# Patient Record
Sex: Female | Born: 1935 | Race: White | Hispanic: No | Marital: Married | Smoking: Never smoker
Health system: Southern US, Community
[De-identification: ages and names within clinical notes are randomized; demographics above are authoritative.]

## PROBLEM LIST (undated history)

## (undated) DIAGNOSIS — I4892 Unspecified atrial flutter: Secondary | ICD-10-CM

## (undated) DIAGNOSIS — E785 Hyperlipidemia, unspecified: Secondary | ICD-10-CM

## (undated) DIAGNOSIS — I4891 Unspecified atrial fibrillation: Secondary | ICD-10-CM

## (undated) DIAGNOSIS — I10 Essential (primary) hypertension: Secondary | ICD-10-CM

## (undated) DIAGNOSIS — I6529 Occlusion and stenosis of unspecified carotid artery: Secondary | ICD-10-CM

## (undated) HISTORY — DX: Hyperlipidemia, unspecified: E78.5

## (undated) HISTORY — DX: Essential (primary) hypertension: I10

## (undated) HISTORY — DX: Unspecified atrial flutter: I48.92

## (undated) HISTORY — DX: Occlusion and stenosis of unspecified carotid artery: I65.29

## (undated) HISTORY — DX: Unspecified atrial fibrillation: I48.91

---

## 2003-07-14 HISTORY — PX: BREAST BIOPSY: SHX20

## 2004-07-13 HISTORY — PX: LOBECTOMY: SHX5089

## 2004-08-07 ENCOUNTER — Ambulatory Visit: Payer: Self-pay | Admitting: Family Medicine

## 2005-05-26 ENCOUNTER — Ambulatory Visit: Payer: Self-pay | Admitting: Family Medicine

## 2005-06-03 ENCOUNTER — Ambulatory Visit: Payer: Self-pay | Admitting: Internal Medicine

## 2005-06-12 ENCOUNTER — Ambulatory Visit: Payer: Self-pay | Admitting: Internal Medicine

## 2005-06-13 ENCOUNTER — Ambulatory Visit: Payer: Self-pay | Admitting: Internal Medicine

## 2005-06-23 ENCOUNTER — Ambulatory Visit: Payer: Self-pay | Admitting: General Surgery

## 2005-07-01 ENCOUNTER — Inpatient Hospital Stay: Payer: Self-pay | Admitting: General Surgery

## 2005-07-14 ENCOUNTER — Ambulatory Visit: Payer: Self-pay | Admitting: General Surgery

## 2005-07-28 ENCOUNTER — Ambulatory Visit: Payer: Self-pay | Admitting: Internal Medicine

## 2005-08-14 ENCOUNTER — Ambulatory Visit: Payer: Self-pay | Admitting: General Surgery

## 2005-09-28 ENCOUNTER — Ambulatory Visit: Payer: Self-pay | Admitting: General Surgery

## 2005-11-17 ENCOUNTER — Ambulatory Visit: Payer: Self-pay | Admitting: Family Medicine

## 2005-12-01 ENCOUNTER — Ambulatory Visit: Payer: Self-pay | Admitting: Family Medicine

## 2006-04-22 ENCOUNTER — Ambulatory Visit: Payer: Self-pay | Admitting: Family Medicine

## 2006-05-07 ENCOUNTER — Ambulatory Visit: Payer: Self-pay | Admitting: Family Medicine

## 2007-05-05 ENCOUNTER — Ambulatory Visit: Payer: Self-pay | Admitting: Family Medicine

## 2007-06-28 ENCOUNTER — Ambulatory Visit: Payer: Self-pay | Admitting: General Surgery

## 2008-05-07 ENCOUNTER — Ambulatory Visit: Payer: Self-pay | Admitting: Family Medicine

## 2008-06-26 ENCOUNTER — Ambulatory Visit: Payer: Self-pay | Admitting: General Surgery

## 2008-08-23 ENCOUNTER — Ambulatory Visit: Payer: Self-pay | Admitting: Gastroenterology

## 2008-09-10 ENCOUNTER — Ambulatory Visit: Payer: Self-pay | Admitting: Gastroenterology

## 2008-09-13 ENCOUNTER — Ambulatory Visit: Payer: Self-pay | Admitting: Gastroenterology

## 2008-12-27 ENCOUNTER — Inpatient Hospital Stay: Payer: Self-pay | Admitting: Internal Medicine

## 2009-06-17 ENCOUNTER — Ambulatory Visit: Payer: Self-pay | Admitting: General Surgery

## 2009-12-12 ENCOUNTER — Ambulatory Visit: Payer: Self-pay | Admitting: Family Medicine

## 2010-05-13 ENCOUNTER — Ambulatory Visit: Payer: Self-pay | Admitting: Internal Medicine

## 2010-06-02 ENCOUNTER — Encounter: Payer: Self-pay | Admitting: Cardiovascular Disease

## 2010-06-03 ENCOUNTER — Inpatient Hospital Stay: Payer: Self-pay | Admitting: Internal Medicine

## 2010-06-03 ENCOUNTER — Encounter: Payer: Self-pay | Admitting: Cardiovascular Disease

## 2010-06-04 ENCOUNTER — Ambulatory Visit: Payer: Self-pay | Admitting: Cardiovascular Disease

## 2010-06-05 ENCOUNTER — Inpatient Hospital Stay: Payer: Self-pay | Admitting: *Deleted

## 2010-06-05 ENCOUNTER — Encounter: Payer: Self-pay | Admitting: Cardiovascular Disease

## 2010-06-12 ENCOUNTER — Ambulatory Visit: Payer: Self-pay | Admitting: Internal Medicine

## 2010-06-14 ENCOUNTER — Encounter: Payer: Self-pay | Admitting: Cardiovascular Disease

## 2010-06-16 ENCOUNTER — Ambulatory Visit: Payer: Self-pay | Admitting: Internal Medicine

## 2010-06-25 ENCOUNTER — Encounter: Payer: Self-pay | Admitting: Cardiovascular Disease

## 2010-06-25 ENCOUNTER — Ambulatory Visit: Payer: Self-pay | Admitting: Cardiovascular Disease

## 2010-06-25 DIAGNOSIS — I495 Sick sinus syndrome: Secondary | ICD-10-CM | POA: Insufficient documentation

## 2010-06-25 DIAGNOSIS — D0220 Carcinoma in situ of unspecified bronchus and lung: Secondary | ICD-10-CM | POA: Insufficient documentation

## 2010-06-25 DIAGNOSIS — I4891 Unspecified atrial fibrillation: Secondary | ICD-10-CM | POA: Insufficient documentation

## 2010-06-25 DIAGNOSIS — I10 Essential (primary) hypertension: Secondary | ICD-10-CM

## 2010-07-03 ENCOUNTER — Encounter: Payer: Self-pay | Admitting: Cardiovascular Disease

## 2010-07-13 ENCOUNTER — Ambulatory Visit: Payer: Self-pay | Admitting: Internal Medicine

## 2010-08-12 NOTE — Letter (Signed)
Summary: Discharge [facility]  Discharge Summary-[facility]-Danyela Torr   Imported By: Harlon Flor 06/17/2010 10:24:26  _____________________________________________________________________  External Attachment:    Type:   Image     Comment:   External Document

## 2010-08-12 NOTE — Medication Information (Signed)
Summary: Tax adviser   Imported By: [place] Carbo 06/06/2010 09:20:32  _____________________________________________________________________  External Attachment:    Type:   Image     Comment:   External Document

## 2010-08-12 NOTE — Letter (Signed)
SummaryScientist, physiological [facility]   [facility]   Imported By: Roderic Ovens 06/17/2010 16:04:01  _____________________________________________________________________  External Attachment:    Type:   Image     Comment:   External Document

## 2010-08-13 ENCOUNTER — Ambulatory Visit: Payer: Self-pay | Admitting: Internal Medicine

## 2010-08-14 NOTE — Consult Note (Signed)
SummaryScientist, physiological [facility]   [facility]   Imported By: Roderic Ovens 07/10/2010 11:36:07  _____________________________________________________________________  External Attachment:    Type:   Image     Comment:   External Document

## 2010-08-14 NOTE — Letter (Signed)
SummaryScientist, physiological [facility]   [facility]   Imported By: Roderic Ovens 07/10/2010 11:36:39  _____________________________________________________________________  External Attachment:    Type:   Image     Comment:   External Document

## 2010-08-14 NOTE — Assessment & Plan Note (Signed)
Summary: [facility]AMD   Visit Type:  Initial Consult Primary Provider:  Wonda Cheng, M.D.  CC:  F/U [facility].  c/o feeling fatigue and shortness of breath..  History of Present Illness: Ms. Foody is a very pleasant 77 year old woman with history of COPD, remote smoking history, lung cancer with recent admission to [facility] for acute respiratory failure, hypertension, rapid atrial ablation/flutter who presents to establish care.   She was seen by myself in the hospital on November 22. She was in atrial fibrillation with RVR. She was started on metoprolol and amiodarone. She converted to normal sinus rhythm and was discharged with rates in the 70s. She presents today and states that she is doing well. Her husband reports she has some thick cough and shortness of breath in the morning this clears up as the day goes on. She does use inhalers, oxygen and nebulizers.. She denies any significant chest pain. Now back having radiation treatment and has 17 more treatments to go.  Echocardiogram November 22 shows normal systolic function, ejection fraction 50-55%, diastolic dysfunction, normal right ventricular systolic pressures. Normal left atrial size.  CT Scan showed a large subcarinal mediastinal mass with invasion of the right mainstem bronchus.  EKG today shows sinus bradycardia with rate 45 beats per minute, nonspecific ST changes  Current Medications (verified): 1)  Fergon 240 (27 Fe) Mg Tabs (Ferrous Gluconate) .Marland Kitchen.. 1 Tablet Once Daily 2)  Alendronate Sodium 70 Mg Tabs (Alendronate Sodium) .Marland Kitchen.. 1 Tablet Once Daily 3)  Vitamin D3 1000 Unit Caps (Cholecalciferol) .Marland Kitchen.. 1 Tablet Once Daily 4)  Methotrexate 2.5 Mg Tabs (Methotrexate Sodium) .Marland Kitchen.. 1 Tablet Once A Week 5)  Protonix 40 Mg Solr (Pantoprazole Sodium) .Marland Kitchen.. 1 Tablet Once Daily 6)  Folic Acid 1 Mg Tabs (Folic Acid) .Marland Kitchen.. 1 Tablet Once Daily 7)  Lovastatin 40 Mg Tabs (Lovastatin) .Marland Kitchen.. 1 Tablet Once Daily 8)  Oscal 500/200 D-3 500-200 Mg-Unit  Tabs (Calcium Carbonate-Vitamin D) .Marland Kitchen.. 1 Tablet Daily 9)  Amiodarone Hcl 200 Mg Tabs (Amiodarone Hcl) .Marland Kitchen.. 1 Tablet Two Times A Day 10)  Metoprolol Succinate 25 Mg Xr24h-Tab (Metoprolol Succinate) .Marland Kitchen.. 1 Tablet Two Times A Day 11)  Ecotrin 325 Mg Tbec (Aspirin) .... One Tablet Once Daily 12)  Furosemide 20 Mg Tabs (Furosemide) .... One Tablet Once Daily 13)  Potassium Chloride Cr 10 Meq Cr-Caps (Potassium Chloride) .... One Tablet Once Daily 14)  Lisinopril 10 Mg Tabs (Lisinopril) .... Take Two Tablets Daily 15)  Combivent 18-103 Mcg/act Aero (Ipratropium-Albuterol) .... Two Puffs As Needed Every 4-6 Hours As Needed  Allergies (verified): 1)  ! Amoxicillin  Past History:  Past Medical History: Last updated: 2010/07/05 Atrial Fibrillation Atrial Flutter Carotid stenosis Hyperlipidemia Hypertension  Past Surgical History: Last updated: 07/05/10 Right lobe lobectomy in 2006 Right breast bx in 2005 found to be fibroadenoma  Family History: Last updated: 07/05/10 Father:deceased-pneumonia Mother:deceased-cardiac disease  Social History: Last updated: 07/05/10 Retired  Married  Tobacco Use - No.  Alcohol Use - no Regular Exercise - no Drug Use - no  Risk Factors: Exercise: no (July 05, 2010)  Risk Factors: Smoking Status: never (05-Jul-2010)  Review of Systems  The patient denies fever, weight loss, weight gain, vision loss, decreased hearing, hoarseness, chest pain, syncope, dyspnea on exertion, peripheral edema, prolonged cough, abdominal pain, incontinence, muscle weakness, depression, and enlarged lymph nodes.         SOB in Am with cough that clears  Vital Signs:  Patient profile:   77 year old female Height:  65 inches Weight:      106.25 pounds BMI:     17.74 Pulse rate:   45 / minute BP sitting:   126 / 70  (left arm) Cuff size:   regular  Vitals Entered By: Lysbeth Galas CMA (June 25, 2010 11:26 AM)  Physical Exam  General:  Thin elderly  woman appearing very frail Head:  normocephalic and atraumatic Neck:  Neck supple, no JVD. No masses, thyromegaly or abnormal cervical nodes. Lungs:  decreased breath sounds on the right, clear on the left Heart:  Non-displaced PMI, chest non-tender; regular rate and rhythm, S1, S2 with II/VI SEM RSB,  norubs or gallops. Carotid upstroke normal, no bruit. Pedals normal pulses. No edema, no varicosities. Abdomen:  Bowel sounds positive; abdomen soft and non-tender without masses Msk:  steady though slow gait, normal muscle tone given her age Pulses:  pulses normal in all 4 extremities Extremities:  No clubbing or cyanosis. Neurologic:  Alert and oriented x 3. Skin:  Intact without lesions or rashes. Psych:  Normal affect.   Impression & Recommendations:  Problem # 1:  ATRIAL FIBRILLATION (ICD-427.31) she is maintaining normal sinus rhythm on amiodarone and metoprolol. Her heart rate is low and we will decrease her amiodarone and metoprolol by one half.  Her updated medication list for this problem includes:    Amiodarone Hcl 200 Mg Tabs (Amiodarone hcl) .Marland Kitchen... 1 tablet once daily    Metoprolol Tartrate 25 Mg Tabs (Metoprolol tartrate) .Marland Kitchen... 1/2 tablet two times a day    Ecotrin 325 Mg Tbec (Aspirin) ..... One tablet once daily  Problem # 2:  SINUS BRADYCARDIA (ICD-427.81) sinus bradycardia today in the office. We will decrease her medications as above. She is relatively asymptomatic.  Her updated medication list for this problem includes:    Amiodarone Hcl 200 Mg Tabs (Amiodarone hcl) .Marland Kitchen... 1 tablet once daily    Metoprolol Tartrate 25 Mg Tabs (Metoprolol tartrate) .Marland Kitchen... 1/2 tablet two times a day    Ecotrin 325 Mg Tbec (Aspirin) ..... One tablet once daily    Lisinopril 10 Mg Tabs (Lisinopril) .Marland Kitchen... Take two tablets daily  Problem # 3:  HYPERTENSION, BENIGN (ICD-401.1) Blood pressure is significantly well controlled on her current medication regimen. I suspect her previous  hypertension on admission to the hospital was secondary to her respiratory distress.  Her updated medication list for this problem includes:    Metoprolol Tartrate 25 Mg Tabs (Metoprolol tartrate) .Marland Kitchen... 1/2 tablet two times a day    Ecotrin 325 Mg Tbec (Aspirin) ..... One tablet once daily    Furosemide 20 Mg Tabs (Furosemide) ..... One tablet once daily    Lisinopril 10 Mg Tabs (Lisinopril) .Marland Kitchen... Take two tablets daily  Problem # 4:  CARCINOMA IN SITU OF BRONCHUS AND LUNG (ICD-231.2) Lung cancer, undergoing radiation treatment.   Patient Instructions: 1)  Your physician recommends that you schedule a follow-up appointment in: 3 months 2)  Your physician has recommended you make the following change in your medication: DECREASE Amiodarone 200mg  to once daily. DECREASE Metoprolol Tartrate to 1/2 tablet two times a day. Prescriptions: METOPROLOL TARTRATE 25 MG TABS (METOPROLOL TARTRATE) 1/2 tablet two times a day  #60 x 6   Entered by:   Lanny Hurst RN   Authorized by:   Dossie Arbour MD   Signed by:   Lanny Hurst RN on 06/25/2010   Method used:   Electronically to        [facility] [phone]* (retail)  [address] [place]  [address]       Ph: [phone]       Fax: [phone]   RxID:   [id number]

## 2010-08-14 NOTE — Miscellaneous (Signed)
Summary: Home Care Report  Home Care Report   Imported By: Lysbeth Galas CMA 07/08/2010 12:10:55  _____________________________________________________________________  External Attachment:    Type:   Image     Comment:   External Document

## 2010-09-09 ENCOUNTER — Encounter: Payer: Self-pay | Admitting: Cardiovascular Disease

## 2010-09-11 ENCOUNTER — Ambulatory Visit: Payer: Self-pay | Admitting: Internal Medicine

## 2010-09-22 ENCOUNTER — Encounter: Payer: Self-pay | Admitting: Cardiovascular Disease

## 2010-09-23 NOTE — Miscellaneous (Signed)
Summary: Orders  Orders   Imported By: Harlon Flor 09/17/2010 12:54:44  _____________________________________________________________________  External Attachment:    Type:   Image     Comment:   External Document

## 2010-09-24 ENCOUNTER — Ambulatory Visit: Payer: Self-pay | Admitting: Cardiovascular Disease

## 2010-09-30 NOTE — Miscellaneous (Signed)
Summary: Hospice  Hospice   Imported By: Harlon Flor 09/23/2010 09:43:31  _____________________________________________________________________  External Attachment:    Type:   Image     Comment:   External Document

## 2010-10-12 ENCOUNTER — Ambulatory Visit: Payer: Self-pay | Admitting: Internal Medicine

## 2011-10-10 ENCOUNTER — Emergency Department: Payer: Self-pay | Admitting: Emergency Medicine

## 2012-01-08 IMAGING — CT CT CHEST W/ CM
2 series · 15 of 31 positions shown, 19 images · IV contrast (agent unspecified)
Comparison: none

REASON FOR EXAM: dyspnea, elevated d dimer, pe protocol
COMMENTS:   May transport without cardiac monitor

PROCEDURE:     CT  - CT CHEST WITH CONTRAST  - June 02, 2010  [DATE]
RESULT:
TECHNIQUE: Helical 3 mm sections were obtained from the thoracic inlet
through the lung bases status post intravenous administration of 100 ml of
[id number].

[Series 8: soft tissue · axial · 0.63mm/px · z∈[-842,-800]mm · 2 of 92 slices shown]
[im 8/92  mediastinal]
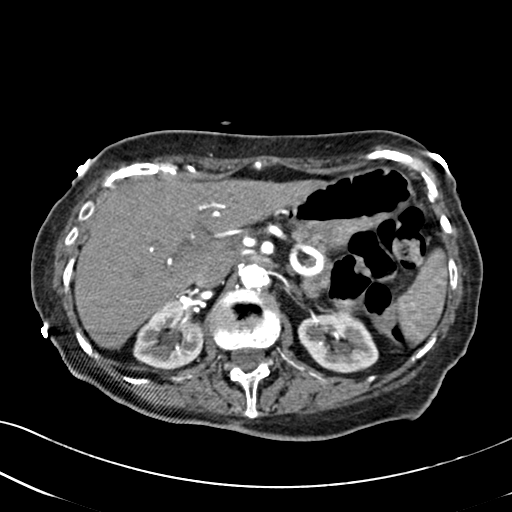
[im 22/92  mediastinal]
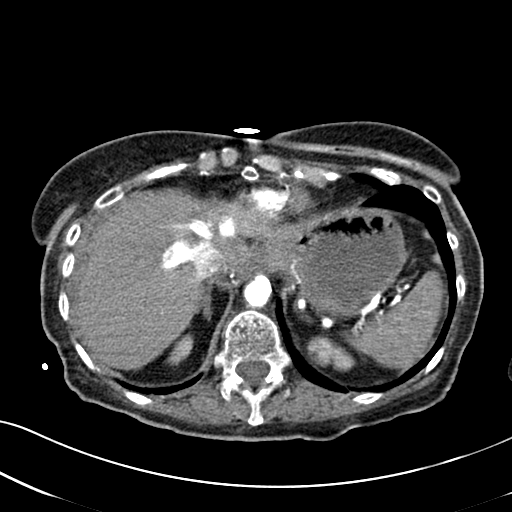

[Series 9: lung windows · axial · 0.63mm/px · z∈[-836,-611]mm · 13 of 89 slices shown, 17 images]
[im 7/89  mediastinal]
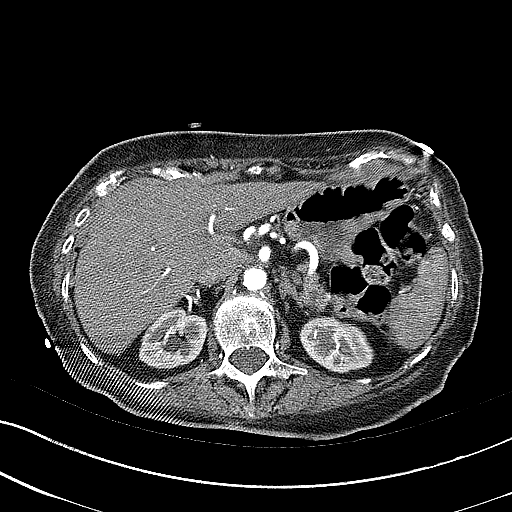
[im 7/89  lung]
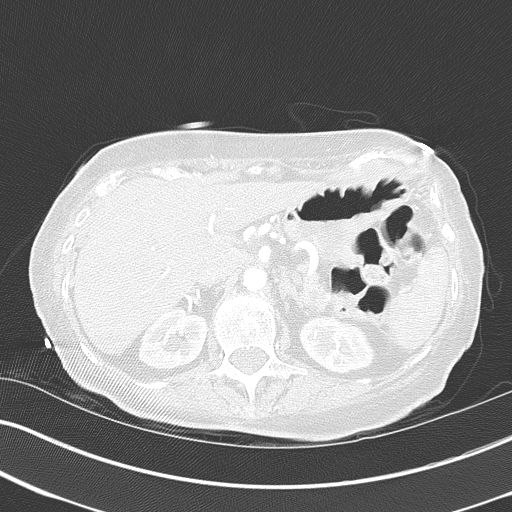
[im 14/89  lung]
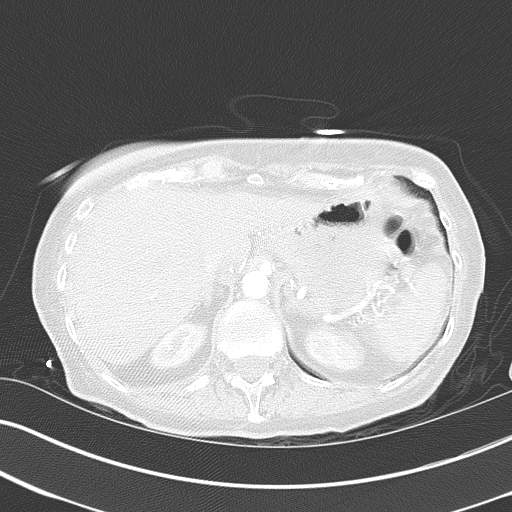
[im 21/89  lung]
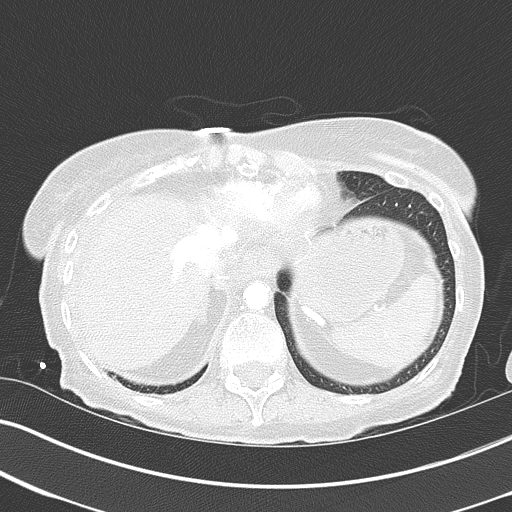
[im 28/89  lung]
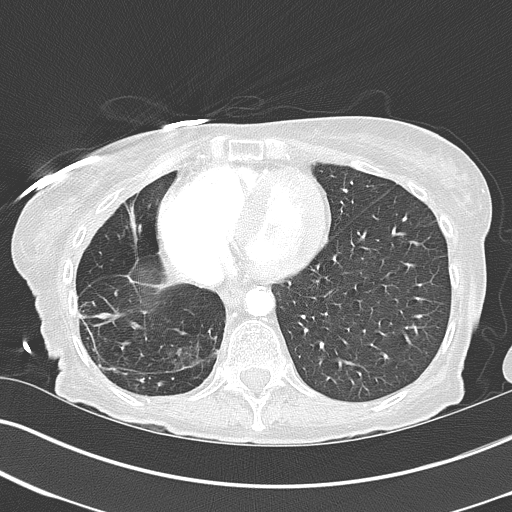
[im 34/89  mediastinal]
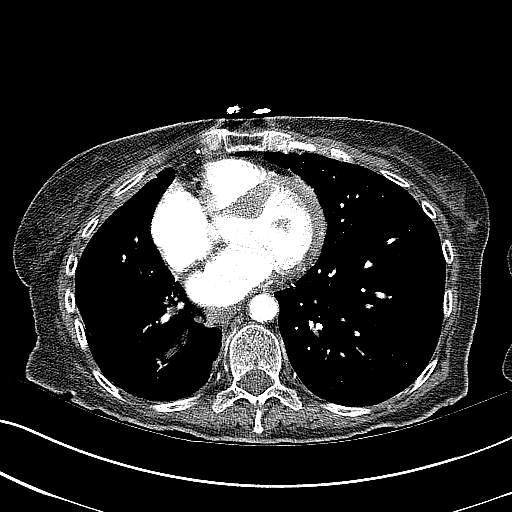
[im 34/89  lung]
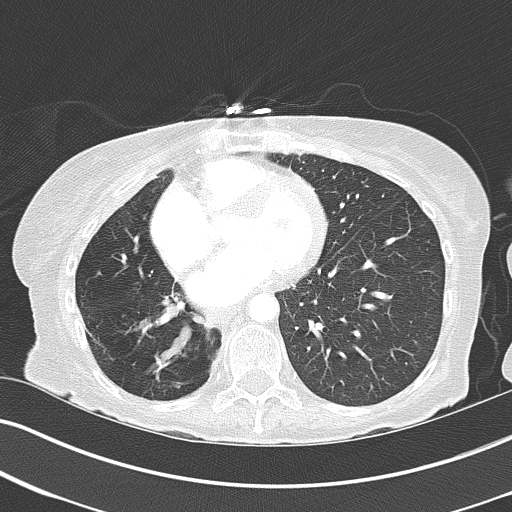
[im 41/89  lung]
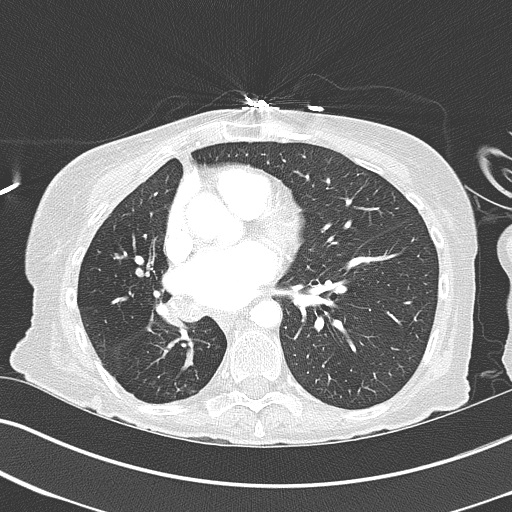
[im 45/89  lung]
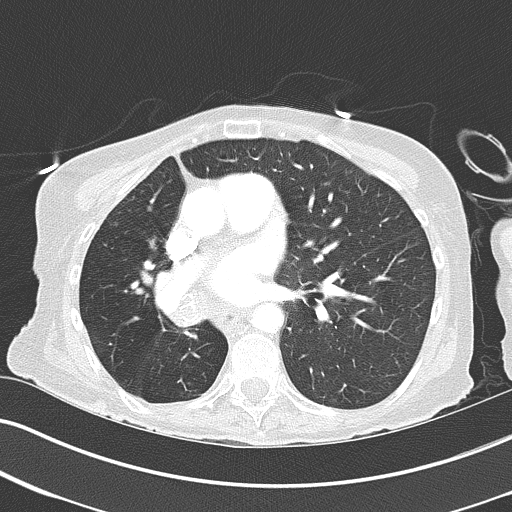
[im 48/89  lung]
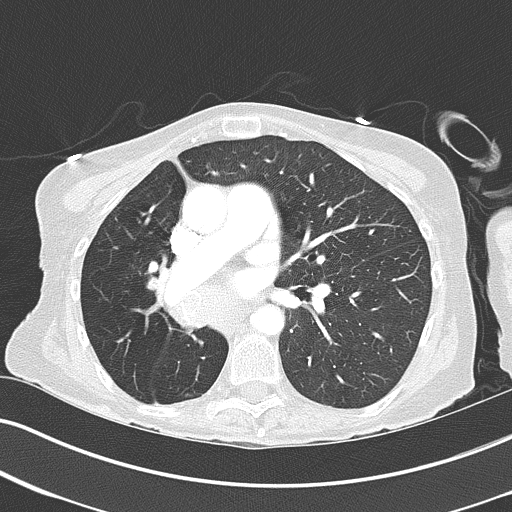
[im 55/89  mediastinal]
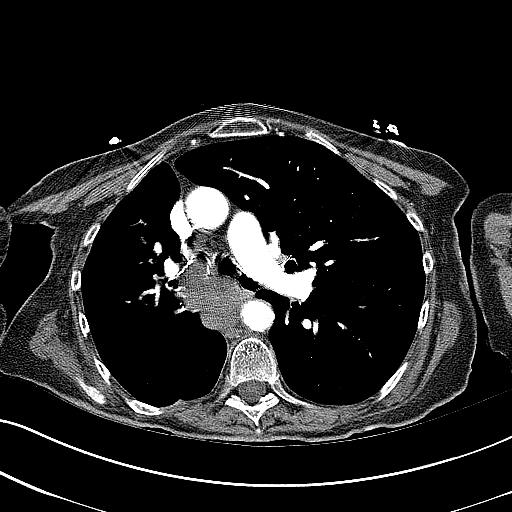
[im 55/89  lung]
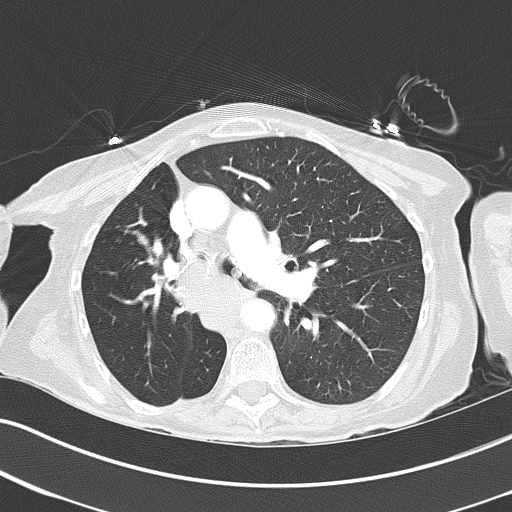
[im 61/89  lung]
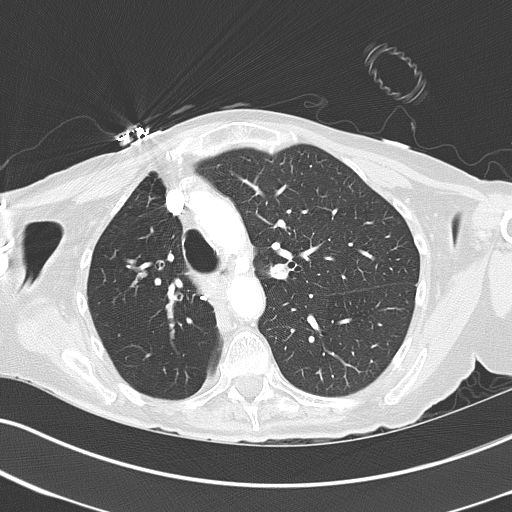
[im 68/89  lung]
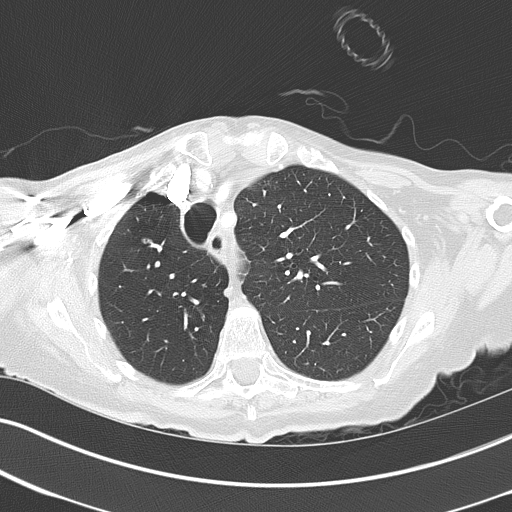
[im 75/89  lung]
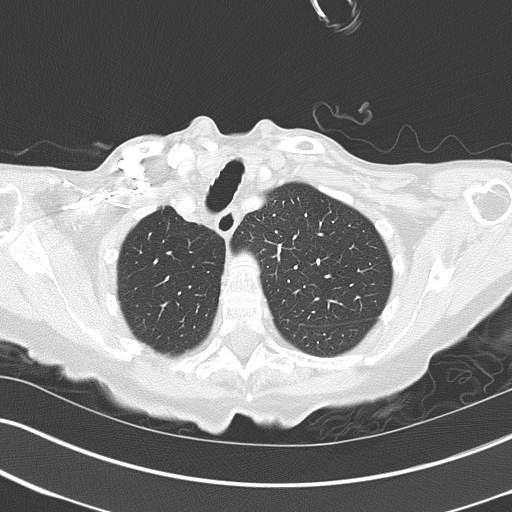
[im 82/89  mediastinal]
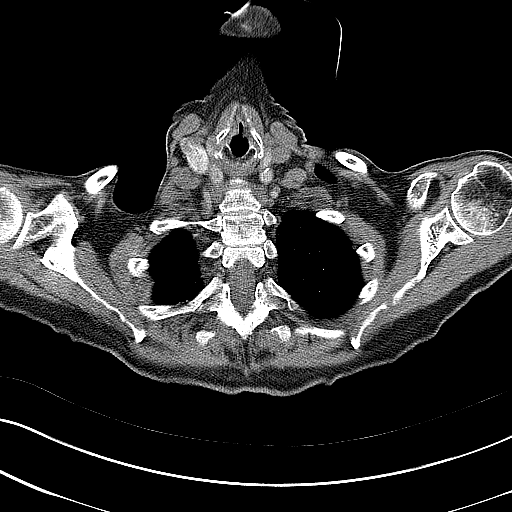
[im 82/89  lung]
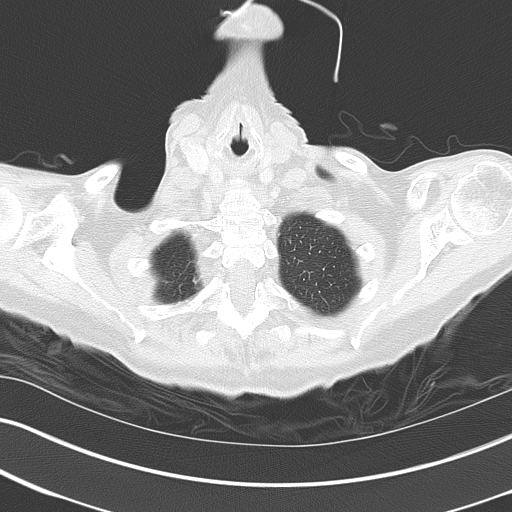

[15 of 31 positions shown; findings below may reference images not displayed]

FINDINGS: Evaluation of the mediastinum and hilar regions and structures
demonstrates a 4.52 x 5.15 cm, subcarinal mass. There are findings
consistent with invasion of the right mainstem bronchus as well as invasion
of the bronchus of the right lower lobe. The mass also partially compresses
or possibly invades the left mainstem bronchus. The right distal mainstem
bronchial invasion is extensive. There is no evidence of post obstructive
atelectasis or post obstructive pneumonitis. No further masses or adenopathy
is appreciated. There is no evidence of filling defects within the main,
lobar, or segmental pulmonary arteries. Evaluation of the lung parenchyma
demonstrates volume loss within the right hemithorax when compared to the
left as well as shift of the mediastinal structures to the right. No focal
regions of consolidation or focal infiltrates are appreciated. Linear areas
of increased density project within the right lung base likely representing
scarring and/or atelectasis.

The visualized upper abdominal viscera demonstrate no gross abnormalities.
IMPRESSION: 1.  Large subcarinal mediastinal mass as described above with invasion of
the right mainstem bronchus.
2.  No CT evidence of pulmonary arterial embolic disease.
3.  Dr. Tarla of the Emergency Department was informed of these findings
via a preliminary faxed report.

## 2012-08-11 ENCOUNTER — Ambulatory Visit: Payer: Self-pay | Admitting: Internal Medicine

## 2012-09-07 ENCOUNTER — Ambulatory Visit: Payer: Self-pay | Admitting: Internal Medicine

## 2012-09-07 LAB — BASIC METABOLIC PANEL
Anion Gap: 9 (ref 7–16)
BUN: 12 mg/dL (ref 7–18)
Creatinine: 0.84 mg/dL (ref 0.60–1.30)
EGFR (Non-African Amer.): >60
Glucose: 95 mg/dL (ref 65–99)
Osmolality: 283 (ref 275–301)
Potassium: 4.6 mmol/L (ref 3.5–5.1)
Sodium: 142 mmol/L (ref 136–145)

## 2012-09-07 LAB — HEPATIC FUNCTION PANEL A (ARMC)
Bilirubin, Direct: 0.1 mg/dL (ref 0.00–0.20)
Bilirubin,Total: 0.4 mg/dL (ref 0.2–1.0)
SGOT(AST): 16 U/L (ref 15–37)
SGPT (ALT): 17 U/L (ref 12–78)
Total Protein: 7.3 g/dL (ref 6.4–8.2)

## 2012-09-07 LAB — CBC CANCER CENTER
Basophil #: 0.1 x10 3/mm (ref 0.0–0.1)
Eosinophil #: 0.1 x10 3/mm (ref 0.0–0.7)
HCT: 40.6 % (ref 35.0–47.0)
Lymphocyte %: 10.3 %
MCH: 32.5 pg (ref 26.0–34.0)
MCHC: 33.3 g/dL (ref 32.0–36.0)
MCV: 98 fL (ref 80–100)
Monocyte #: 0.6 x10 3/mm (ref 0.2–0.9)
Neutrophil #: 7.3 x10 3/mm — ABNORMAL HIGH (ref 1.4–6.5)
Platelet: 290 x10 3/mm (ref 150–440)
WBC: 9 x10 3/mm (ref 3.6–11.0)

## 2012-09-10 ENCOUNTER — Ambulatory Visit: Payer: Self-pay | Admitting: Internal Medicine

## 2013-04-12 ENCOUNTER — Ambulatory Visit: Payer: Self-pay | Admitting: Internal Medicine

## 2013-04-14 ENCOUNTER — Ambulatory Visit: Payer: Self-pay | Admitting: Orthopedic Surgery

## 2013-04-14 ENCOUNTER — Inpatient Hospital Stay: Payer: Self-pay | Admitting: Internal Medicine

## 2013-04-14 LAB — URINALYSIS, COMPLETE
Bilirubin,UR: NEGATIVE
Blood: NEGATIVE
Glucose,UR: NEGATIVE mg/dL (ref 0–75)
Ketone: NEGATIVE
Leukocyte Esterase: NEGATIVE
Protein: NEGATIVE
WBC UR: <1 /HPF (ref 0–5)

## 2013-04-14 LAB — BASIC METABOLIC PANEL
BUN: 11 mg/dL (ref 7–18)
Co2: 28 mmol/L (ref 21–32)
Creatinine: 0.73 mg/dL (ref 0.60–1.30)
EGFR (African American): >60
EGFR (Non-African Amer.): >60
Glucose: 109 mg/dL — ABNORMAL HIGH (ref 65–99)

## 2013-04-14 LAB — CBC
HCT: 39.3 % (ref 35.0–47.0)
MCHC: 33.6 g/dL (ref 32.0–36.0)
MCV: 95 fL (ref 80–100)
RBC: 4.14 10*6/uL (ref 3.80–5.20)

## 2013-04-15 LAB — CBC WITH DIFFERENTIAL/PLATELET
Basophil #: 0.1 10*3/uL (ref 0.0–0.1)
Basophil %: 0.5 %
Eosinophil #: 0 10*3/uL (ref 0.0–0.7)
Eosinophil %: 0 %
HGB: 12 g/dL (ref 12.0–16.0)
Lymphocyte %: 4.4 %
MCHC: 34.1 g/dL (ref 32.0–36.0)
Monocyte #: 0.8 x10 3/mm (ref 0.2–0.9)
Monocyte %: 6.1 %
RDW: 14 % (ref 11.5–14.5)
WBC: 12.8 10*3/uL — ABNORMAL HIGH (ref 3.6–11.0)

## 2013-04-15 LAB — BASIC METABOLIC PANEL
Anion Gap: 6 — ABNORMAL LOW (ref 7–16)
BUN: 13 mg/dL (ref 7–18)
Co2: 29 mmol/L (ref 21–32)
Creatinine: 0.78 mg/dL (ref 0.60–1.30)
EGFR (African American): >60
EGFR (Non-African Amer.): >60
Glucose: 123 mg/dL — ABNORMAL HIGH (ref 65–99)
Osmolality: 281 (ref 275–301)
Potassium: 4 mmol/L (ref 3.5–5.1)

## 2013-04-15 LAB — MRSA PCR SCREENING

## 2013-04-16 LAB — CBC WITH DIFFERENTIAL/PLATELET
Basophil #: 0.1 10*3/uL (ref 0.0–0.1)
Basophil %: 0.2 %
Eosinophil #: 0 10*3/uL (ref 0.0–0.7)
Eosinophil %: 0 %
HCT: 28.7 % — ABNORMAL LOW (ref 35.0–47.0)
HGB: 9.7 g/dL — ABNORMAL LOW (ref 12.0–16.0)
MCH: 32.5 pg (ref 26.0–34.0)
MCHC: 33.7 g/dL (ref 32.0–36.0)
MCV: 97 fL (ref 80–100)
Monocyte #: 1.5 x10 3/mm — ABNORMAL HIGH (ref 0.2–0.9)
Monocyte %: 6.4 %
Neutrophil %: 90.3 %
RBC: 2.97 10*6/uL — ABNORMAL LOW (ref 3.80–5.20)
RDW: 14.5 % (ref 11.5–14.5)
WBC: 24.2 10*3/uL — ABNORMAL HIGH (ref 3.6–11.0)

## 2013-04-16 LAB — HEPATIC FUNCTION PANEL A (ARMC)
Albumin: 2.3 g/dL — ABNORMAL LOW (ref 3.4–5.0)
Alkaline Phosphatase: 65 U/L (ref 50–136)
Bilirubin, Direct: 0.2 mg/dL (ref 0.00–0.20)
Bilirubin,Total: 0.6 mg/dL (ref 0.2–1.0)
SGOT(AST): 23 U/L (ref 15–37)
Total Protein: 4.8 g/dL — ABNORMAL LOW (ref 6.4–8.2)

## 2013-04-16 LAB — BASIC METABOLIC PANEL
BUN: 17 mg/dL (ref 7–18)
Chloride: 106 mmol/L (ref 98–107)
EGFR (Non-African Amer.): >60
Glucose: 94 mg/dL (ref 65–99)
Potassium: 5.4 mmol/L — ABNORMAL HIGH (ref 3.5–5.1)

## 2013-04-16 LAB — ALBUMIN: Albumin: 2.3 g/dL — ABNORMAL LOW (ref 3.4–5.0)

## 2013-04-18 LAB — CBC WITH DIFFERENTIAL/PLATELET
Basophil #: 0 10*3/uL (ref 0.0–0.1)
Eosinophil %: 0.2 %
HCT: 22.6 % — ABNORMAL LOW (ref 35.0–47.0)
Lymphocyte #: 0.5 10*3/uL — ABNORMAL LOW (ref 1.0–3.6)
Lymphocyte %: 4.5 %
MCH: 32.4 pg (ref 26.0–34.0)
MCHC: 34.3 g/dL (ref 32.0–36.0)
Monocyte %: 6.4 %
Neutrophil #: 10.6 10*3/uL — ABNORMAL HIGH (ref 1.4–6.5)
Neutrophil %: 88.5 %
Platelet: 202 10*3/uL (ref 150–440)
WBC: 12 10*3/uL — ABNORMAL HIGH (ref 3.6–11.0)

## 2013-04-18 LAB — BASIC METABOLIC PANEL
Anion Gap: 8 (ref 7–16)
BUN: 12 mg/dL (ref 7–18)
Chloride: 102 mmol/L (ref 98–107)
EGFR (African American): >60
Osmolality: 268 (ref 275–301)
Potassium: 3 mmol/L — ABNORMAL LOW (ref 3.5–5.1)

## 2013-04-19 LAB — BASIC METABOLIC PANEL
Anion Gap: 6 — ABNORMAL LOW (ref 7–16)
Chloride: 103 mmol/L (ref 98–107)
EGFR (Non-African Amer.): >60
Glucose: 75 mg/dL (ref 65–99)
Osmolality: 267 (ref 275–301)
Potassium: 3.3 mmol/L — ABNORMAL LOW (ref 3.5–5.1)

## 2013-04-19 LAB — PATHOLOGY REPORT

## 2013-04-20 LAB — MAGNESIUM: Magnesium: 1.6 mg/dL — ABNORMAL LOW

## 2013-04-21 LAB — CBC WITH DIFFERENTIAL/PLATELET
Basophil %: 0.5 %
Eosinophil #: 0.1 10*3/uL (ref 0.0–0.7)
Lymphocyte #: 0.6 10*3/uL — ABNORMAL LOW (ref 1.0–3.6)
MCH: 33.1 pg (ref 26.0–34.0)
Monocyte #: 1.3 x10 3/mm — ABNORMAL HIGH (ref 0.2–0.9)
Platelet: 286 10*3/uL (ref 150–440)
RBC: 3.39 10*6/uL — ABNORMAL LOW (ref 3.80–5.20)
RDW: 15.5 % — ABNORMAL HIGH (ref 11.5–14.5)

## 2013-04-21 LAB — MAGNESIUM: Magnesium: 1.6 mg/dL — ABNORMAL LOW

## 2013-04-21 LAB — POTASSIUM: Potassium: 4 mmol/L (ref 3.5–5.1)

## 2013-05-05 ENCOUNTER — Inpatient Hospital Stay: Payer: Self-pay | Admitting: Internal Medicine

## 2013-05-05 LAB — COMPREHENSIVE METABOLIC PANEL
Albumin: 2.4 g/dL — ABNORMAL LOW (ref 3.4–5.0)
Alkaline Phosphatase: 134 U/L (ref 50–136)
BUN: 20 mg/dL — ABNORMAL HIGH (ref 7–18)
Bilirubin,Total: 0.5 mg/dL (ref 0.2–1.0)
Calcium, Total: 9.2 mg/dL (ref 8.5–10.1)
Co2: 31 mmol/L (ref 21–32)
Creatinine: 0.71 mg/dL (ref 0.60–1.30)
EGFR (African American): >60
EGFR (Non-African Amer.): >60
Osmolality: 270 (ref 275–301)
Potassium: 5.3 mmol/L — ABNORMAL HIGH (ref 3.5–5.1)
SGOT(AST): 32 U/L (ref 15–37)
SGPT (ALT): 19 U/L (ref 12–78)
Sodium: 128 mmol/L — ABNORMAL LOW (ref 136–145)

## 2013-05-05 LAB — CBC
HCT: 35.7 % (ref 35.0–47.0)
MCHC: 33.9 g/dL (ref 32.0–36.0)
MCV: 93 fL (ref 80–100)
Platelet: 440 10*3/uL (ref 150–440)
RBC: 3.84 10*6/uL (ref 3.80–5.20)
RDW: 14.9 % — ABNORMAL HIGH (ref 11.5–14.5)

## 2013-05-05 LAB — CK TOTAL AND CKMB (NOT AT ARMC): CK, Total: 45 U/L (ref 21–215)

## 2013-05-05 LAB — TROPONIN I: Troponin-I: 0.39 ng/mL — ABNORMAL HIGH

## 2013-05-06 LAB — CBC WITH DIFFERENTIAL/PLATELET
Basophil #: 0 10*3/uL (ref 0.0–0.1)
Eosinophil #: 0 10*3/uL (ref 0.0–0.7)
Eosinophil %: 0 %
Lymphocyte %: 2.8 %
MCHC: 33.4 g/dL (ref 32.0–36.0)
MCV: 93 fL (ref 80–100)
Monocyte #: 0.5 x10 3/mm (ref 0.2–0.9)
Neutrophil #: 12.6 10*3/uL — ABNORMAL HIGH (ref 1.4–6.5)
Neutrophil %: 93.3 %
Platelet: 374 10*3/uL (ref 150–440)
RBC: 3.88 10*6/uL (ref 3.80–5.20)

## 2013-05-06 LAB — COMPREHENSIVE METABOLIC PANEL
Albumin: 2.1 g/dL — ABNORMAL LOW (ref 3.4–5.0)
Alkaline Phosphatase: 108 U/L (ref 50–136)
Anion Gap: 7 (ref 7–16)
BUN: 22 mg/dL — ABNORMAL HIGH (ref 7–18)
Bilirubin,Total: 0.5 mg/dL (ref 0.2–1.0)
Co2: 28 mmol/L (ref 21–32)
Creatinine: 1.19 mg/dL (ref 0.60–1.30)
Glucose: 137 mg/dL — ABNORMAL HIGH (ref 65–99)
Osmolality: 272 (ref 275–301)

## 2013-05-10 LAB — CULTURE, BLOOD (SINGLE)

## 2013-05-13 ENCOUNTER — Ambulatory Visit: Payer: Self-pay | Admitting: Internal Medicine

## 2013-05-13 DEATH — deceased

## 2014-11-02 NOTE — H&P (Signed)
PATIENT NAME:  Christina Flowers, Christina Flowers MR#:  [id number] DATE OF BIRTH:  [birth year]  DATE OF ADMISSION:  04/14/2013  PRIMARY CARE PROVIDER: Dr. Sherrlyn Hock, followed by home hospice.   REFERRING PHYSICIAN: Dr. Shaune Pollack.   CHIEF COMPLAINT: Status post fall, right hip fracture.   HISTORY OF PRESENT ILLNESS: The patient is an 81 year old white female with history of recurrent lung cancer who is currently being followed by hospice due to her lung cancer and advanced dementia. She is mostly wheelchair bound but is able to get into the wheelchair with her husband's assistance. She was in a rocking chair and tried to get up and fell. She sustained a hip fracture, and we are asked to admit the patient. The patient has advanced dementia according to her husband and is unable to give me any history. He reports that she was doing okay with her baseline and has been eating and drinking normally. He does report that she has been coughing a lot more. Again, unable to provide any history. She does have a history of paroxysmal Afib.    PAST MEDICAL HISTORY:  1. History of stage I non-small lung cancer, status post lobectomy in 2006 with positive PET scan again.  2. Hyperlipidemia.  3. Hypertension.  4. History of carotid stenosis.  5. Osteoporosis.  6. Rheumatoid arthritis.  7. History of vitamin D deficiency.  8. Anemia.  9. History of low B12.  10. Bilateral femoral bruits.  11. Status post gastric polyps removal and history of gastropathy.  12. Right breast biopsy in 2005 for fibroadenoma.   MEDICATIONS: As per list from oncology clinic, Fergon 240 one 1 tab p.o. daily, alendronate 70 mg q. weekly, methotrexate 2.5 five tabs q. weekly, Protonix 40 daily, amiodarone 200 one tab p.o. b.i.d., Senokot-S 1 tab p.o. daily, Zoloft 50 daily, lorazepam 0.5 at bedtime, Tylenol 650 one tab p.o. b.i.d., loratadine 10 one tab p.o. daily, sorbitol q.6 p.r.n. for constipation, MiraLAX 17 g daily as needed, Tussionex 1 to 2 (Dictation  Anomaly)  q.4 p.r.n.   SOCIAL HISTORY: Does not smoke. Does not drink. Lives with her husband.   FAMILY HISTORY: Positive for hypertension.   REVIEW OF SYSTEMS: Unobtainable.   PHYSICAL EXAMINATION:  VITAL SIGNS: Temperature 97.6, pulse 80, respirations 18, blood pressure 196/81, O2 95%.  GENERAL: The patient is a very frail-looking Caucasian female. She is awake but not oriented to place, person or time.  HEENT: Head atraumatic, normocephalic. Pupils equally round and reactive to light and accommodation. There is no conjunctival pallor. No scleral icterus. Nasal exam shows no drainage or ulceration. Oropharynx is clear without any exudate.  NECK: Supple without any JVD. Marland Kitchen  CARDIOVASCULAR: Regular rate and rhythm. No murmurs, rubs, clicks or gallops. PMI is not displaced.  ABDOMEN: Soft, nontender, nondistended. Positive bowel sounds x 4. No hepatosplenomegaly.  EXTREMITIES: No clubbing, cyanosis or edema.  SKIN: No rash.  LYMPHATICS: No lymph nodes palpable.  VASCULAR: Good DP and PT pulses.  PSYCHIATRIC: Not anxious or depressed currently.  NEUROLOGIC: Spontaneously moving all extremities. Not following commands.   LABORATORY AND RADIOLOGICAL DATA: EKG: Normal sinus rhythm with possible anterior septal infarct. WBC count 14.1, hemoglobin 13.2, platelet count 276. BMP: Glucose 109, BUN 11, creatinine 0.73, sodium 139, potassium 4.1, chloride 104, CO2 is 28. Chest x-ray showed enlarging right hilar mass.   ASSESSMENT AND PLAN: The patient is an 81 year old white female with recurrent lung cancer, advanced dementia, followed by home hospice, status post fall, has right-sided hip fracture.  1. Right-sided hip fracture status post fall: The patient obviously at moderate of complications due to her age and other medical issues. She does have a lung cancer. At this point, orthopedics to see the patient. Discussed with family regarding benefits of the surgery in this patient. No further  cardiopulmonary workup needed if taken to the operating room.  2. Leukocytosis, likely reactive due to fall and stress. UA is pending.  3. Advanced dementia: Followed by hospice.  4. Hypertension, accelerated: Will do p.r.n. hydralazine.  5. History of atrial fibrillation: Will place her on telemetry.   CODE STATUS: DNR which is confirmed with her husband.   ____________________________ Lacie ScottsShreyang H. Allena KatzPatel, MD shp:gb D: 04/14/2013 19:53:44 ET T: 04/14/2013 20:03:29 ET JOB#: [id number]  cc: Javell Blackburn H. Allena KatzPatel, MD, <Dictator> Charise CarwinSHREYANG H Selin Eisler MD ELECTRONICALLY SIGNED 04/28/2013 18:27

## 2014-11-02 NOTE — H&P (Signed)
PATIENT NAME:  Christina Flowers, Christina Flowers MR#:  [id number] DATE OF BIRTH:  Oct 15, 1923  DATE OF ADMISSION:  05/05/2013  REFERRING PHYSICIAN: Dr. Kerman Passey.   PRIMARY CARE PHYSICIAN: Dr. Jacqualine Code.   CHIEF COMPLAINT: Altered mental status.   This is an 81 year old Caucasian female with past medical history of rheumatoid arthritis on methotrexate, anemia, hyperlipidemia, hypertension, paroxysmal atrial fibrillation, lung cancer and recent right hip fracture, presenting with altered mental status since her discharge earlier this month for hip fracture. She has been residing at a nursing facility, [facility]. She has been generally fatigued and weak since discharge. Today, she was found at the nursing facility to be minimally responsive. Her pulse ox was taken and found to be into the 30s despite being on oxygen of unknown dosage. EMS called. She was placed on nonrebreather with improved saturations. Her family is at bedside. States that she has been having some coughing and choking after eating for the last few days, but no recent fevers, chills, chest pain, shortness of breath or otherwise cough.   REVIEW OF SYSTEMS:  Unable to obtain secondary to the patient's mental status and medical condition.   PAST MEDICAL HISTORY: Rheumatoid arthritis, carotid stenosis, anemia, hyperlipidemia, proximal atrial fibrillation, hypertension, lung cancer status post lobectomy of right lower lobe in 2006, dementia and right hip fracture.   SOCIAL HISTORY: Negative for alcohol, tobacco or drug usage. Currently resides at [facility] after discharge from recent hip fracture.   FAMILY HISTORY: Positive for hypertension.   HOME MEDICATIONS: Acetaminophen/oxycodone 325/5, 1 tab q.4-6h. as needed for pain, amiodarone 200 mg 1/2 tab p.o. daily, Senna-S 50/8.6 mg 1 tab p.o. b.i.d. as needed for constipation, Lovenox 40 mg subcutaneous injection daily, Fergon 240 mg p.o. daily, folic acid 1 mg p.o. daily,  loratadine 10 mg p.o. daily, magnesium oxide 400 mg 1 tab 3 times daily, methotrexate 12.5 mg once weekly on Wednesday, MiraLAX 17 grams p.o. as needed for constipation, pantoprazole 40 mg p.o. daily, sertraline 50 mg p.o. daily.   PHYSICAL EXAMINATION: VITAL SIGNS: Temperature 100.8 degrees Fahrenheit, pulse 92, respirations 18, blood pressure 115/45, saturating 97% on nonrebreather. Weight 54.3 kg, BMI 19.4.  GENERAL: Chronically ill-appearing Caucasian female in acute distress secondary to respiratory status and mental status.  HEAD: Normocephalic, atraumatic.  EYES: Pupils equal, round, reactive to light. She is unable to follow commands to assess extraocular muscles, but there is no scleral icterus.  MOUTH: Reveals dry mucosal membranes. Dentition intact. No abscesses noted.  EAR, NOSE, THROAT:  Throat clear without exudate. No external lesions.  NECK: Supple. No thyromegaly. No nodules appreciated. No JVD.  PULMONARY: Diffusely diminished breath sounds on the right with scattered coarse breath sounds throughout all lung fields. She has supraclavicular retractions and diminished respiratory effort.  CHEST: Appears nontender to palpation.  CARDIOVASCULAR: S1, S2, regular rate and rhythm. No murmur, rub or gallop. No edema. Pedal pulses 2+ bilaterally.  GASTROINTESTINAL: Soft, nontender, nondistended. No masses. No hepatosplenomegaly. Positive bowel sounds.  MUSCULOSKELETAL: No swelling or clubbing. There is trace lower extremity edema bilaterally. Passive range of motion is full in all extremities. She has some spontaneous movement of upper extremities, though not to command.  NEUROLOGIC: Unable to assess secondary to the patient's current medical condition and mental status. However, she has movement of upper extremities in response to pain. These are nonlocalizing. She has not opened her eyes to verbal or painful stimuli.  SKIN: No ulcerations, lesions, rashes or cyanosis. Skin is warm and dry.  Turgor is intact.  PSYCHIATRIC: Unable to assess secondary to patient's current medical condition and mental status, as she is minimally responsive.   LABORATORY DATA: Sodium 128, potassium 5.3, chloride 94, bicarb 31, BUN 20, creatinine 0.71, glucose 292. Total protein 6.9, albumin 2.4, bilirubin 0.5, alk phos 134, AST 32, ALT 19. CK 45, CK-MB 1.1, troponin I 0.39. WBC 26.7, hemoglobin 12.1, platelets of 440. URINALYSIS: Unable to obtain secondary to small amount of urine, though grossly cloudy.  Enough has been sent for culture. Chest x-ray reveals blunting of the left side. There is right lower lobe collapse with diffuse interstitial infiltrate in the right upper, middle and lower lobe, most prominent in the lower. EKG: Normal sinus rhythm, heart rate of 95, anterolateral ST abnormality  ASSESSMENT AND PLAN: An 81 year old Caucasian female with history of rheumatoid arthritis on methotrexate, as well as lung cancer, which is PET positive. She has a history of right lower lobectomy, as well as recent right hip fracture, presenting with altered mental status, found to be markedly hypoxemic at her nursing facility.  1.  Acute hypoxemic respiratory failure, likely secondary to aspiration. Supplemental O2 to keep O2 sat greater than 92%. DuoNeb q.4. Chest PT. We will use BiPAP as required. The patient will be made n.p.o.  Will need swallow study if condition improves.  2.  Sepsis from either urinary or pulmonary source. Panculture urine, blood, sputum. Broad-spectrum antibiotics with Zosyn to cover gram-positive and negative anaerobics, and vancomycin to cover MRSA. She has been recently hospitalized and currently residing in a nursing facility. IV fluids to MAP greater than 65.  3.  Elevated troponins, likely demand ischemia. Follow cardiac enzymes q.8h. will get repeat EKG. The patient is already receiving Lovenox. We will increase to therapeutic dose and will continue b.i.d. as long as renal function   remains stable if troponins trend up.  4.  Hyperglycemia. Insulin sliding scale q.6h. with goal blood glucose 120 to 180. In critical condition. 5.   Rheumatoid arthritis on methotrexate hold given critical illness while n.p.o.  6.  Depression. Hold medications.  The patient is DO NOT RESUSCITATE INCLUDING NO INTUBATION, NO CPR, NO SHOCK, NO PRESSORS.   CRITICAL CARE TIME: 45 minutes.   Palliative care has been consulted.  ____________________________ Aaron Mose. Hower, MD dkh:dmm D: 05/05/2013 21:14:07 ET T: 05/05/2013 21:39:41 ET JOB#: [id number]  cc: Aaron Mose. Hower, MD, <Dictator> DAVID Woodfin Ganja MD ELECTRONICALLY SIGNED 05/06/2013 4:03

## 2014-11-02 NOTE — Consult Note (Signed)
PATIENT NAME:  Christina Flowers, Christina Flowers MR#:  [id number] DATE OF BIRTH:  August 15, 1923  DATE OF CONSULTATION:  04/14/2013  CONSULTING PHYSICIAN:  Kyra Searles, MD   CHIEF COMPLAINT: Right hip pain.   HISTORY OF PRESENT ILLNESS: Christina Flowers is an 81 year old female who has medical history remarkable for lung cancer and severe dementia, currently on hospice for severe dementia, who is a limited household ambulator. She attempted to get up on her own this evening from her chair in her living room at home, sustained a fall onto her right hip. The patient was unable to relate any meaningful history. The patient's husband states that she really did not complain of any significant repeated complaints of antecedent hip pain on the right side. She does have a history of a PET scan from February of this year, which apparently demonstrated increased activity in the mediastinum and was also noted to have increasing hilar mass on this evening's chest x-ray.   REVIEW OF SYSTEMS:  The patient unable to relate any review of systems.   PAST MEDICAL HISTORY: Lung cancer status post lobectomy in 2006, hyperlipidemia, hypertension, carotid stenosis, osteoporosis, rheumatoid arthritis, vitamin D deficiency, anemia, low B12, femoral bruits, gastric polyps and breast biopsy for fibroadenoma.   PAST SURGICAL HISTORY: Remarkable for left lobectomy and right breast biopsy.   CURRENT MEDICATIONS: Presently Tussionex, MiraLAX, loratadine sorbitol, Tylenol, Zoloft, lorazepam, Senokot, amiodarone, Protonix, methotrexate, alendronate, Fergon.   SOCIAL HISTORY: Nonsmoker, nondrinker. Currently lives at home with her husband.   FAMILY HISTORY: Positive for diabetes and heart disease, as well as dementia.   ALLERGIES:  Include AMOXICILLIN.   PHYSICAL EXAMINATION:  VITAL SIGNS: On presentation to the Emergency Department, temperature 97.6, pulse 80, respirations 18, blood pressure 196/81. Oxygen saturation 95%.  GENERAL: Pleasant, elderly  female, appearing stated age, visiting with her husband and sister-in-law.  Cooperative with examination. PSYCHIATRIC: Mood and affect appropriate.   HEENT: Normocephalic, atraumatic. Sclerae clear. Oral mucosa membranes are slightly dry.  LUNGS: Fair air exchange bilaterally.  HEART: Regular rate and rhythm. Normal S1 and S2. No murmurs or gallops.  ABDOMEN: Soft, nontender, nondistended.  Positive bowel sounds.  SKIN: Warm and intact over the right hip.  VASCULAR: 1+ dorsalis pedis and 2+ posterior tibialis pulse.  NEUROLOGIC: Gross motor and light touch sensation examination intact at the right foot.  LYMPHATIC: Mild swelling over the lateral aspect of the right hip.  MUSCULOSKELETAL: Bilateral upper extremities and left lower extremity nontender to palpation without deformity. Evaluation of the right lower extremity reveals that the lower extremity is shortened and externally rotated. There is mild tenderness to palpation with palpation over the greater trochanteric region.   RADIOGRAPHS:  Reveal a right comminuted intertrochanteric fracture with permeative changes in the proximal femur suggestive of possible metastatic disease. There does not appear to be a large lytic lesion in the region of the hip itself.   Right femur x-rays reveal endosteal scalloping and some permeative changes in the proximal one third of the femur.   There are permeative changes in the mid and distal one third femur as well.   CT scan of the right hip demonstrates a comminuted 4 part intertrochanteric fracture with a basicervical component as well. There are also some intracortical permeative changes as well. There are also moderate to marked degenerative changes in the posterior hip joint as well and some permeative changes as well potentially in the posterior wall of the acetabulum.   IMPRESSION:  1.  Four part right hip  intertrochanteric fracture.  2.  Probable metastatic lung cancer.  3.  Possible metastatic  disease in the right femoral shaft.   PLAN: Hospitalist service has admitted her and has deemed her to be medically optimized for surgical intervention. We will proceed with a long cephalomedullary nailing, given the strong likelihood of metastatic disease in the right femur. Risks, benefits and alternatives will be discussed with the patient's husband to include, but not limited to bleeding, infection, damage to blood vessels and nerves, need for further surgery and treatment, chronic pain, loss of function, stiffness, allergy, anesthetic risk, DVT, PATIENT, heart, lung, brain, kidney complications, refracture, chronic pain, malunion, nonunion, symptomatic hardware, need for hardware removal. We will also place her in traction this evening and proceed with surgery in the morning. All questions were answered.       ____________________________ Kyra SearlesJohn F. Lear Carstens, MD jfs:dmm D: 04/14/2013 21:47:57 ET T: 04/14/2013 22:47:48 ET JOB#: [id number]  cc: Kyra SearlesJohn F. Ehtan Delfavero, MD, <Dictator> Kyra SearlesJOHN F Hildreth Robart MD ELECTRONICALLY SIGNED 04/15/2013 13:54

## 2014-11-02 NOTE — Op Note (Signed)
PATIENT NAME:  Christina, Flowers MR#:  [id number] DATE OF BIRTH:  01/18/1924  DATE OF PROCEDURE:  04/15/2013  PREOPERATIVE DIAGNOSIS: Closed displaced right proximal femur fracture with possible metastatic cancer.   POSTOPERATIVE DIAGNOSIS: Closed displaced right proximal femur fracture with possible metastatic cancer.   PROCEDURE PERFORMED: Long cephalomedullary nailing, right proximal femur intertrochanteric fracture.   ANESTHESIA: Spinal.  ESTIMATED BLOOD LOSS: 100 mL.   OPERATIVE FINDINGS: Reducible, but unstable right hip intertrochanteric fracture.   MATERIALS TO LAB: Proximal femoral reamings.   DRAINS: None.   COMPLICATIONS: None apparent.   IMPLANTS: Stryker 3 Gamma nail, size 11 x 360 mm by 125 degrees, 10.5 x 90 mm lag screw, set screw x1, interlocking screws 5 mm x2.   INDICATIONS: Christina Flowers is an 81 year old female who sustained a fall yesterday with displaced intertrochanteric fracture, indicating need for operative treatment. Risks, benefits and alternatives were discussed with the patient and her husband to include, but not limited to bleeding, infection, damage to blood vessels and nerves, need for further surgery and treatment, chronic pain, loss of function, stiffness, allergy, anesthetic risk, DVT, PE, heart, lung, brain, kidney complications. She and her husband appeared to understand the risks and benefits and desired to proceed with operative treatment. Hospitalist consultation deemed her to be medically stable for operative intervention.   DESCRIPTION OF PROCEDURE: After positive identification of the patient in the preoperative holding area, and after informed consent had been obtained and the correct operative site had been initialed by myself, the patient was taken to the operating room and placed in supine position. Spinal anesthesia had been administered in the preoperative holding area. IV antibiotics were given before any skin incision had been made. Timeout was  performed. The patient was brought down against the perineal post. Traction and internal rotation were applied to the right lower extremity. The well leg was flexed into well-leg holder. TED and sequentials were applied to the opposite leg. Fluoroscopic unit was brought in, showing near-anatomic reduction. The right hip and thigh were prepped and draped in the usual sterile fashion. A longitudinal incision was made proximal to the greater trochanter. Incision was deepened to the tensor fascia lata fascia which was incised in line with the skin incision. Blunt dissection was carried out down to the level of the greater trochanter. Curved awl was placed in an appropriate position on AP and lateral fluoroscopy. The guidewire and 1-step reamer were then advanced across the fracture site. Reamings were obtained. The ball-tipped guidewire was then placed in a center-center position at the distal femur, confirmed under true lateral fluoroscopy of the knee. Successive reaming was carried out from 12 to 13 mm, and the head and neck geometry had templated best to 125 degrees after initial fluoroscopy of the hip reduction. The 11 x 360 mm x 125 degree right gamma 3 nail was then advanced into the distal femur to an appropriate level in the proximal femur using the carbon fiber aiming guide. The guide was brought to the lateral aspect of the femur after a small incision had been made over the lateral aspect of the femur. The guidewire was advanced into a center-center position slightly inferior in the femoral head to an appropriate level. The guidewire depth was 87 mm, hence we opted to place a 90 mm lag screw. The reamer was then used across the guidewire. The guidewire was removed given its proximity to the femoral head or subchondral bone. There was no penetration with any device of the femoral  head during the procedure. The depth of the lag screw was brought to an appropriate level, well less than 25 mm tip-to-apex distance  on both the AP and lateral images, and the set screw was placed with excellent fixation. The perfect circle technique was carried out distally, and two 5 mm interlocking screws were placed in standard AO fashion. Intramedullary position of the screws was confirmed under biplanar fluoroscopy. Final fluoroscopic imaging revealed there to be an anatomic reduction and appropriate position and length of hardware on biplanar fluoroscopy. All wounds were copiously irrigated. The distal 3 incisions were closed with 3-0 nylon, and the proximal incision was closed with 0 PDS in the deep fascia and deep subcutaneous tissues as well as 3-0 PDS and 3-0 nylon in the skin. Then, 60 mL of 20:40 mix of Exparel 40 mL of normal saline was injected proximal to the wounds for a field block using an aspiration injection technique. Aquacel dressings were placed. The patient was transferred to the recovery room bed, where near-equal leg lengths and 100% pulse oximetry of the second right toe were noted. These findings will be relayed to her husband as well as her need for followup and compliance.   ____________________________ Kyra SearlesJohn F. Keiera Strathman, MD jfs:OSi D: 04/15/2013 13:17:14 ET T: 04/15/2013 13:33:17 ET JOB#: [id number]  cc: Kyra SearlesJohn F. Aevah Stansbery, MD, <Dictator> Kyra SearlesJOHN F [facility] MD ELECTRONICALLY SIGNED 04/16/2013 20:43

## 2014-11-02 NOTE — Discharge Summary (Signed)
PATIENT NAME:  Christina Flowers, Christina Flowers MR#:  [id number] DATE OF BIRTH:  Jan 09, 1924  DATE OF ADMISSION:  04/14/2013 DATE OF DISCHARGE:  04/21/2013  ADMITTING DIAGNOSIS: Right hip fracture.   DISCHARGE DIAGNOSES: 1.  Right hip intertrochanteric hip fracture status post long cephalomedullary nailing on the 4th of October 2014 by Christina Flowers. Pathology is negative for malignancy.  2.  Acute posthemorrhagic anemia requiring packed cell blood transfusion.  3.  Constipation.  4.  Failure to thrive, adult.  5.  History of paroxysmal atrial fibrillation, atrial flutter. 6.  History of stage I non-small cell lung carcinoma status post lobectomy in 2006. 7.  Hypertension. 8.  Hyperlipidemia. 9.  Carotid artery stenosis. 10.  Osteoporosis. 11.  Rheumatoid arthritis. 12.  Vitamin B12 and vitamin D deficiency. 13.  Anemia. 14.  Bilateral femoral bruits.   DISCHARGE CONDITION: Stable, poor.  DISCHARGE MEDICATIONS: 1.  Loratadine 10 mg p.o. daily. 2.  Amiodarone 200 mg p.o. 1/2 tablet once daily. 3.  Fergon 240 mg p.o. daily. 4.  Folic acid 1 mg p.o. daily. 5.  Methotrexate 2.5 mg 5 tablets which would be 12.5 mg once weekly on Wednesdays.  6.  MiraLax 17 grams once daily as needed.  7.  Pantoprazole 40 mg p.o. daily.  8.  Docusate senna 50/8.6 one tablet twice daily as needed.  9.  Acetaminophen/hydrocodone 325/5 mg 1 tablet every 4 to 6 hours as needed.  10.  Enoxaparin 40 mg subcutaneously once daily for 14 days then discontinue and begin aspirin therapy 325 mg p.o. daily.  11.  Sertraline 50 mg p.o. daily.  12.  Magnesium oxide 400 mg p.o. 1 tablet every 8 hours.   HOME OXYGEN: None.   DIET: 2 grams salt, low fat, low cholesterol, mechanical soft.   ACTIVITY LIMITATIONS: As tolerated.   REFERRALS: To physical therapy (Dictation Anomaly) 2-7  times a week. Followup appointment with Dr. Marguerite Flowers in 2 days after discharge as well as Christina Flowers in 1 to 2 weeks after discharge.  CONSULTANTS: Care  management, social work, Publishing rights managernurse practitioner Christina Flowers, Dr. Harvie JuniorPhifer, PA Christina Flowers, [place] Mr. Van ClinesJon Flowers, and Christina Flowers.   RADIOLOGIC STUDIES: Chest portable single view 3rd of October 2014 showed enlarging right hilar mass. Pelvic AP on 3rd of October 2014 revealed proximal right femur fracture. Right hip complete x-ray on 3rd of October 2014 revealed proximal right femoral fracture. Femur right x-ray on 3rd of October 2014 showed osteopenia with underlying intertrochanteric fracture. No definite pathologic lesion was appreciated. CT scan of hip, right, without contrast, 3rd of October 2014, revealed intertrochanteric fracture of proximal right femur. Right hip C-arm, 2 views, 4th of October 2014: Status post ORIF for intertrochanteric fracture of the right hip. Further interpretation was deferred to Christina Flowers. Repeated chest portable single view on 6th of October 2014 revealed right hilar mass, probable right pleural effusion. Pleural thickening was not excluded. No significant consolidation. There is apical fibrosis on the right which was unchanged. KUB on 9th of October 2014 revealed no definite obstruction, moderately large amount of fecal material in the colon, correlate for constipation.   HISTORY AND HOSPITAL COURSE: The patient is an 81 year old Caucasian female with past medical history significant for history of non-small cell lung carcinoma who presented to the hospital after a fall with right hip fracture. Please refer to Christina Flowers's admission note on the 3rd of October 2014.   On arrival to the hospital, the patient's temperature was 97.6, pulse was  80, respiratory rate was 18, blood pressure 196/81 and O2 sats were 95% on oxygen therapy. Physical examination was unremarkable. The patient's lab data done on admission revealed normal BMP with glucose of 109, otherwise everything was unremarkable. The patient's CBC showed a white blood cell count elevation to 14.1, hemoglobin was  13.2 and platelet count was 276. Urinalysis was unremarkable. The patient's EKG showed normal sinus rhythm at 78 beats per minute, anteroseptal infarct, age undetermined. No acute ST-T changes were noted. Chest x-ray was unremarkable. The patient's multiple x-rays revealed right intertrochanteric hip fracture.   The patient was seen by Dr. Astrid Drafts on the same day, 3rd of October 2014, who proceeded to operative treatment on the 4th of October 2014. The patient underwent closed displaced right femur fracture repair with long cephalomedullary nailing, right proximal femur intertrochanteric fracture. Approximately 100 mL of estimated blood loss was noted. Post procedure, the patient did satisfactory. She was able to be engaged with physical therapy; however, she remained somewhat weak and required significant support. She was recommended to continue pain medications as well as physical therapy in rehabilitation facility. The patient's pathology from surgery was negative for malignancy. The patient was noted to have acute posthemorrhagic postsurgical anemia. Her hemoglobin level dropped down from admission level of 13.2 to 7.7 on the 7th of October 2014. The patient was transfused with packed red blood cells after which the patient's hemoglobin level improved to 11.2 on the day of discharge, 10th of October 2014. The patient is to continue iron supplementation and follow her hemoglobin level as outpatient.   The patient was noted to have constipation on her KUB, multiple medications were initiated, and she had a good bowel movement on the 9th of October 2014. It is recommended to follow her p.o. intake as well as her output, her stools, every day and make decisions about advancement of her medications as needed to produce stool because we are concerned that her failure to thrive could have been related to significant constipation. It is recommended also to watch her oral intake as the patient had very poor oral  intake while she was in the hospital. By the day of discharge, she was able just to eat a few bites, unfortunately. Palliative care saw the patient in consultation; however, the patient's family, her husband, decided to withdraw DO NOT RESUSCITATE order and now the patient is under FULL CODE. The patient may benefit from re-instatement of hospice if her oral intake does not improve despite treatment for constipation.  The patient was noted to be hypokalemic as well as hypomagnesemic. These (Dictation Anomaly) microelements were supplemented while she was in the hospital and their level became normal by the day of discharge.   In regards to her chronic medical problems, such as PAF, atrial flutter, lung cancer, hypertension, hyperlipidemia and carotid artery stenosis the patient is to continue her outpatient management. No changes were made.   The patient was discharged stable condition with the above-mentioned medications and followup. Her vital signs on the day of discharge: Temperature is 98.4, pulse was 83, respiration rate was 18, blood pressure 116/54 and saturation was 97% on 2 liters of oxygen through nasal cannula at rest.   TIME SPENT: 40 minutes.  ____________________________ Katharina Caper, MD rv:sb D: 04/21/2013 15:37:34 ET T: 04/21/2013 16:16:32 ET JOB#: [id number]  cc: Katharina Caper, MD, <Dictator> Durward Mallard. Christina Olea, MD Illene Labrador. Angie Fava., MD Katharina Caper MD ELECTRONICALLY SIGNED 05/02/2013 23:16

## 2014-11-02 NOTE — Discharge Summary (Signed)
PATIENT NAME:  Christina Flowers, [name] MR#:  [id number] DATE OF BIRTH:  March 23, 1924  DATE OF ADMISSION:  05/05/2013 DATE OF DISCHARGE:  05/06/2013  For a detailed note, please take a look at the history and physical done on admission by Dr. Clint GuyHower.   DIAGNOSES UPON DISCHARGE: Is as follows:  1.  Acute hypoxic respiratory failure. 2.  A non-ST elevation myocardial infarction. 3.  Sepsis.  4.  Altered mental status.  5.  Dementia.   The [facility].   DISCHARGE MEDICATIONS:  1.  Lorazepam 0.5 mg 1 tab q.2 to 4 hours as needed.  2.  Roxanol 0.5 mL q.1 to 2 hours as needed.  3.  Zofran 4 mg q.6 hours as needed.  4.  Glycopyrrolate 0.04 mg t.i.d. as needed.   PERTINENT STUDIES DONE DURING THE HOSPITAL COURSE:  Are as follows: A chest x-ray done on admission showing increased volume loss in the right hemithorax, likely secondary to compressive atelectasis due to right hilar mass, postobstructive pneumonitis is a diagnostic consideration.    HOSPITAL COURSE:  This is an [age] year old female with multiple medical problems including a history of chronic atrial fibrillation, a history of dementia rheumatoid arthritis, paroxysmal a-fib, a history of previous lung cancer status post right-sided lobectomy, a history of recent right hip fracture, who presented to the hospital due to altered mental status from a skilled nursing facility. The patient was noted to be in acute respiratory failure secondary to a possible aspiration pneumonitis. The patient was also noted to have an ST elevation MI with ST elevations in the lateral leads along with elevated troponin. Dr. Clint GuyHower, the admitting physician, had a long discussion with the patient's family about her poor prognosis, and the family did not want to pursue any further cardiologic intervention or any further aggressive treatment. They opted to make the patient comfort care only. On the morning October 25, I had a discussion with the patient's  husband [facility] and he agreed on that. Therefore the patient was transferred there for further care.   CODE STATUS:  The patient is a DNI/DNR.    TIME SPENT:  35 minutes.   ____________________________ Rolly PancakeVivek J. Cherlynn KaiserSainani, MD vjs:jm D: 05/06/2013 14:38:32 ET T: 05/06/2013 15:01:38 ET JOB#: [id number]  cc: Rolly PancakeVivek J. Cherlynn KaiserSainani, MD, <Dictator> Houston SirenVIVEK J Joshu Furukawa MD ELECTRONICALLY SIGNED 05/10/2013 14:32

## 2014-11-20 IMAGING — CR DG FEMUR 2V*R*
1 series · 5 of 5 positions shown · non-contrast
Comparison: none

REASON FOR EXAM: evaluate for femoral mets given hx of lung ca
COMMENTS:

PROCEDURE:     DXR - DXR FEMUR RIGHT  - April 14, 2013  [DATE]
RESULT:     Right femur images demonstrate an intratrochanteric fracture.
Atherosclerotic calcification is present. There is underlying osteopenia.
Degenerative changes are seen in the right knee.

[Series 6: t femur proximal ap right · 0.14mm/px · 5 of 5 slices shown]
[im 1/5]
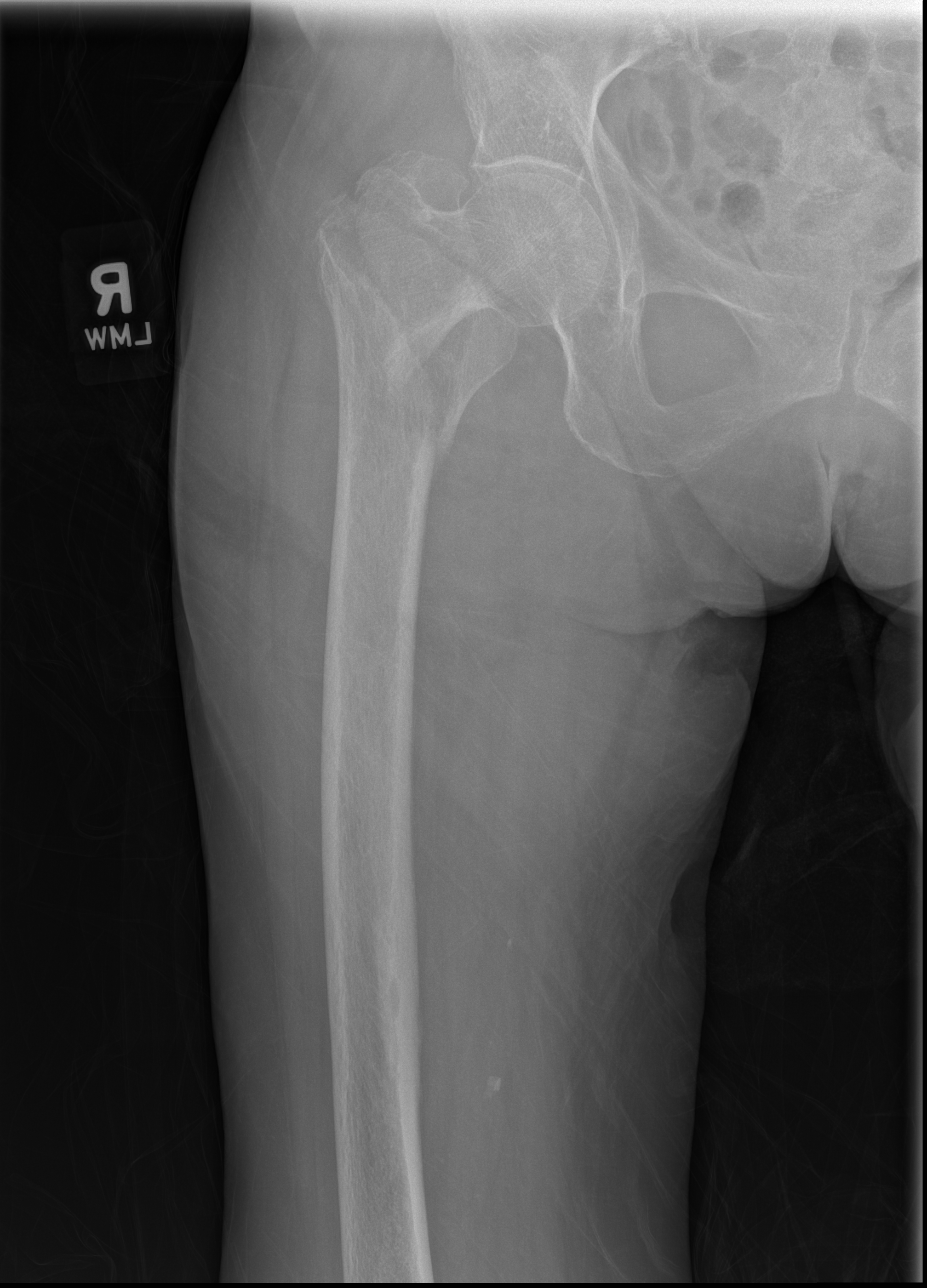
[im 2/5]
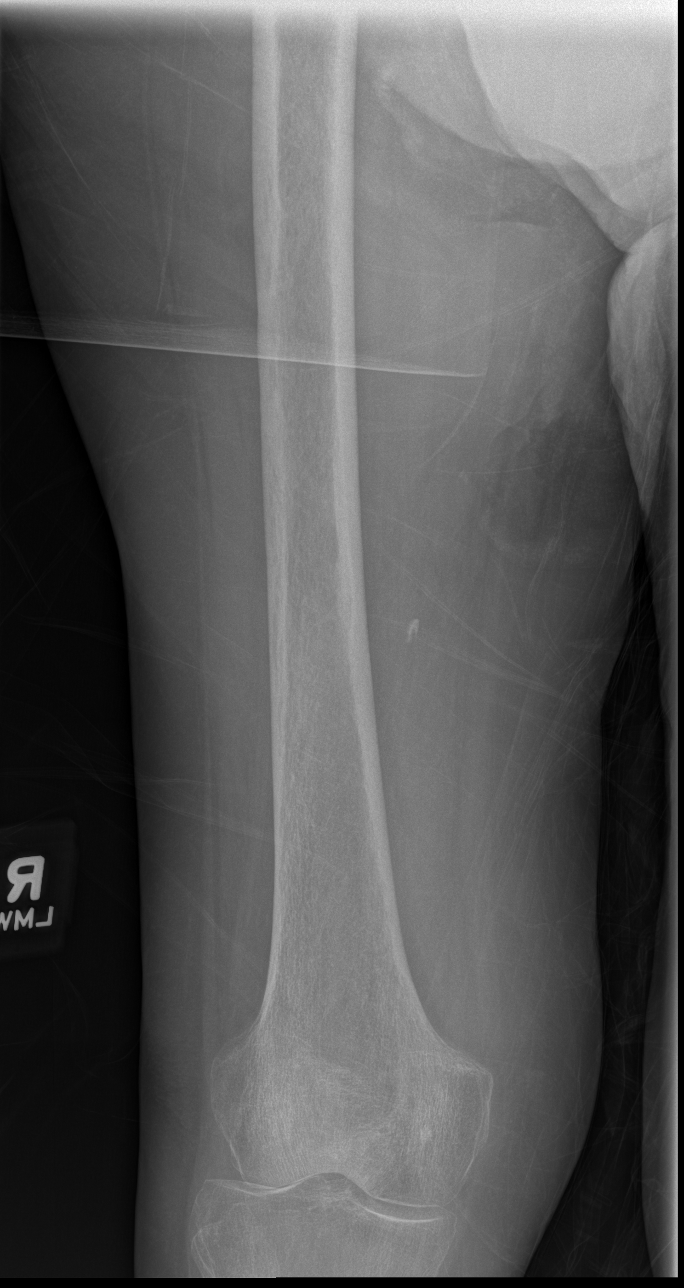
[im 3/5]
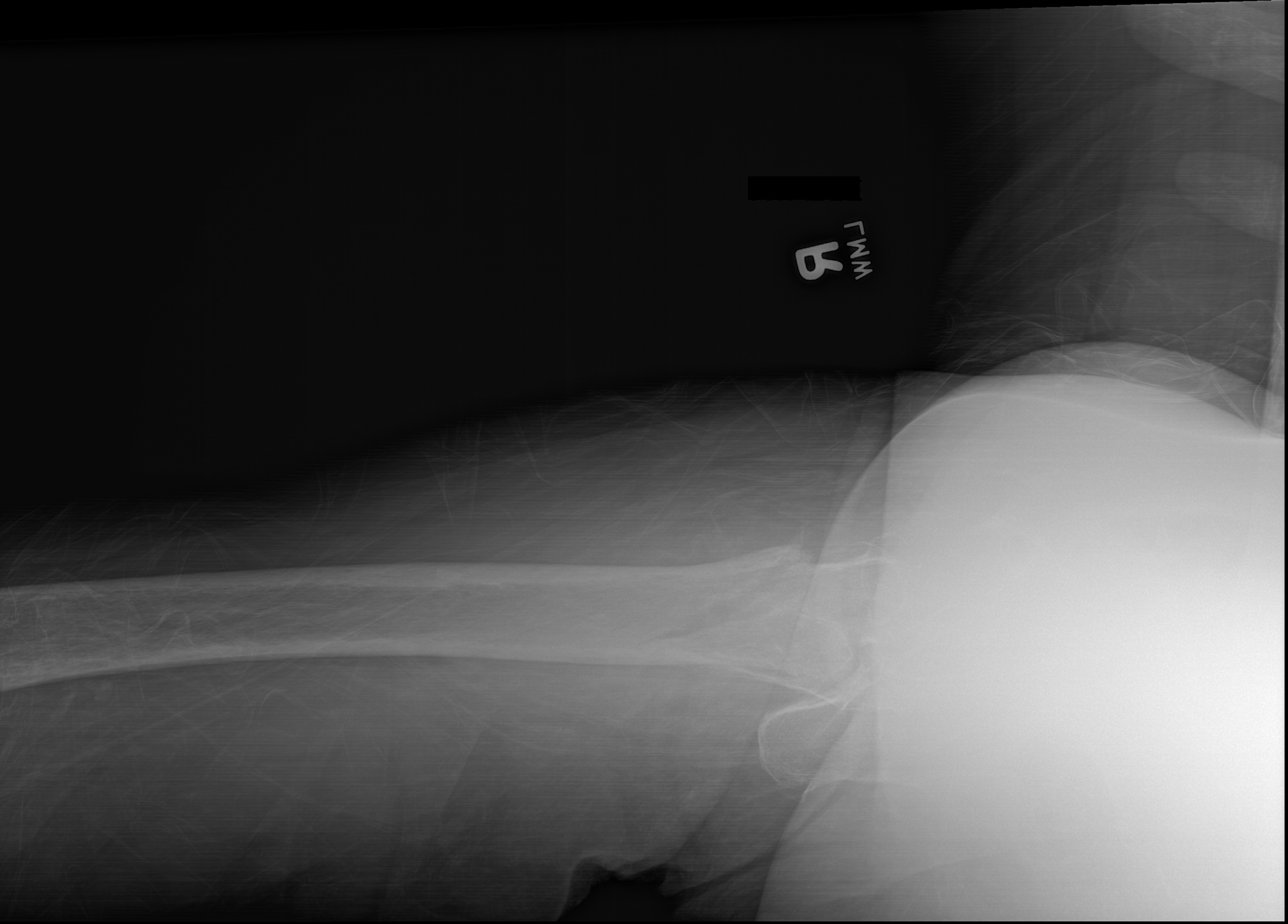
[im 4/5]
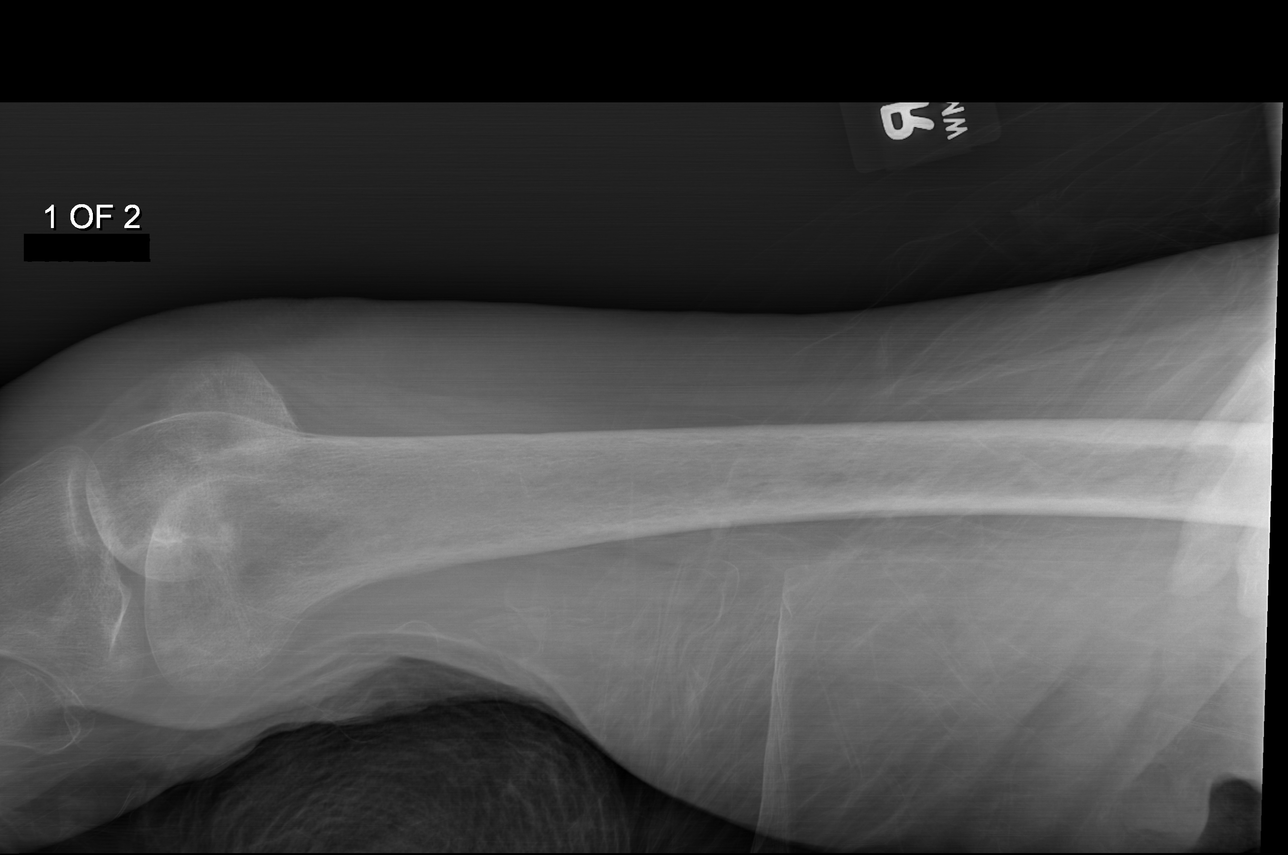
[im 5/5]
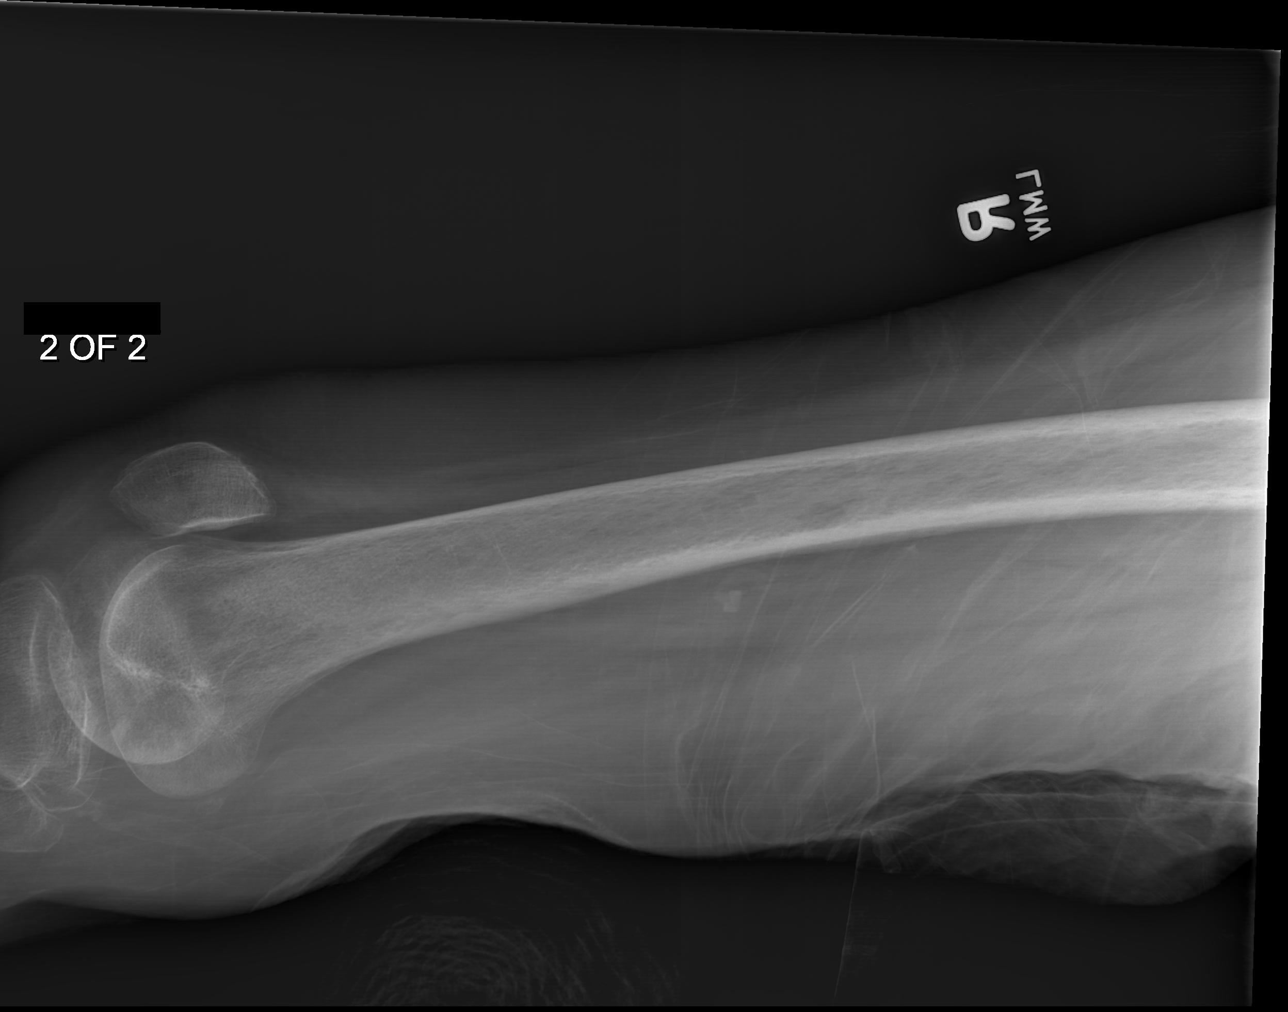

[5 of 5 positions shown; findings below may reference images not displayed]

IMPRESSION: Osteopenia with underlying intratrochanteric fracture. No
definite pathologic lesion is appreciated.

[REDACTED]

## 2014-11-20 IMAGING — CR RIGHT HIP - COMPLETE 2+ VIEW
1 series · 2 of 2 positions shown · non-contrast
Comparison: none

REASON FOR EXAM: fall
COMMENTS:

PROCEDURE:     DXR - DXR HIP RIGHT COMPLETE  - April 14, 2013  [DATE]
RESULT:     Right hip images demonstrate an intratrochanteric fracture in
the proximal right femur. There is minimal distraction. The acetabulum
appears intact. The femoral neck and head appear intact.

[Series 1: t hip ap right · 0.14mm/px · 2 of 2 slices shown]
[im 1/2]
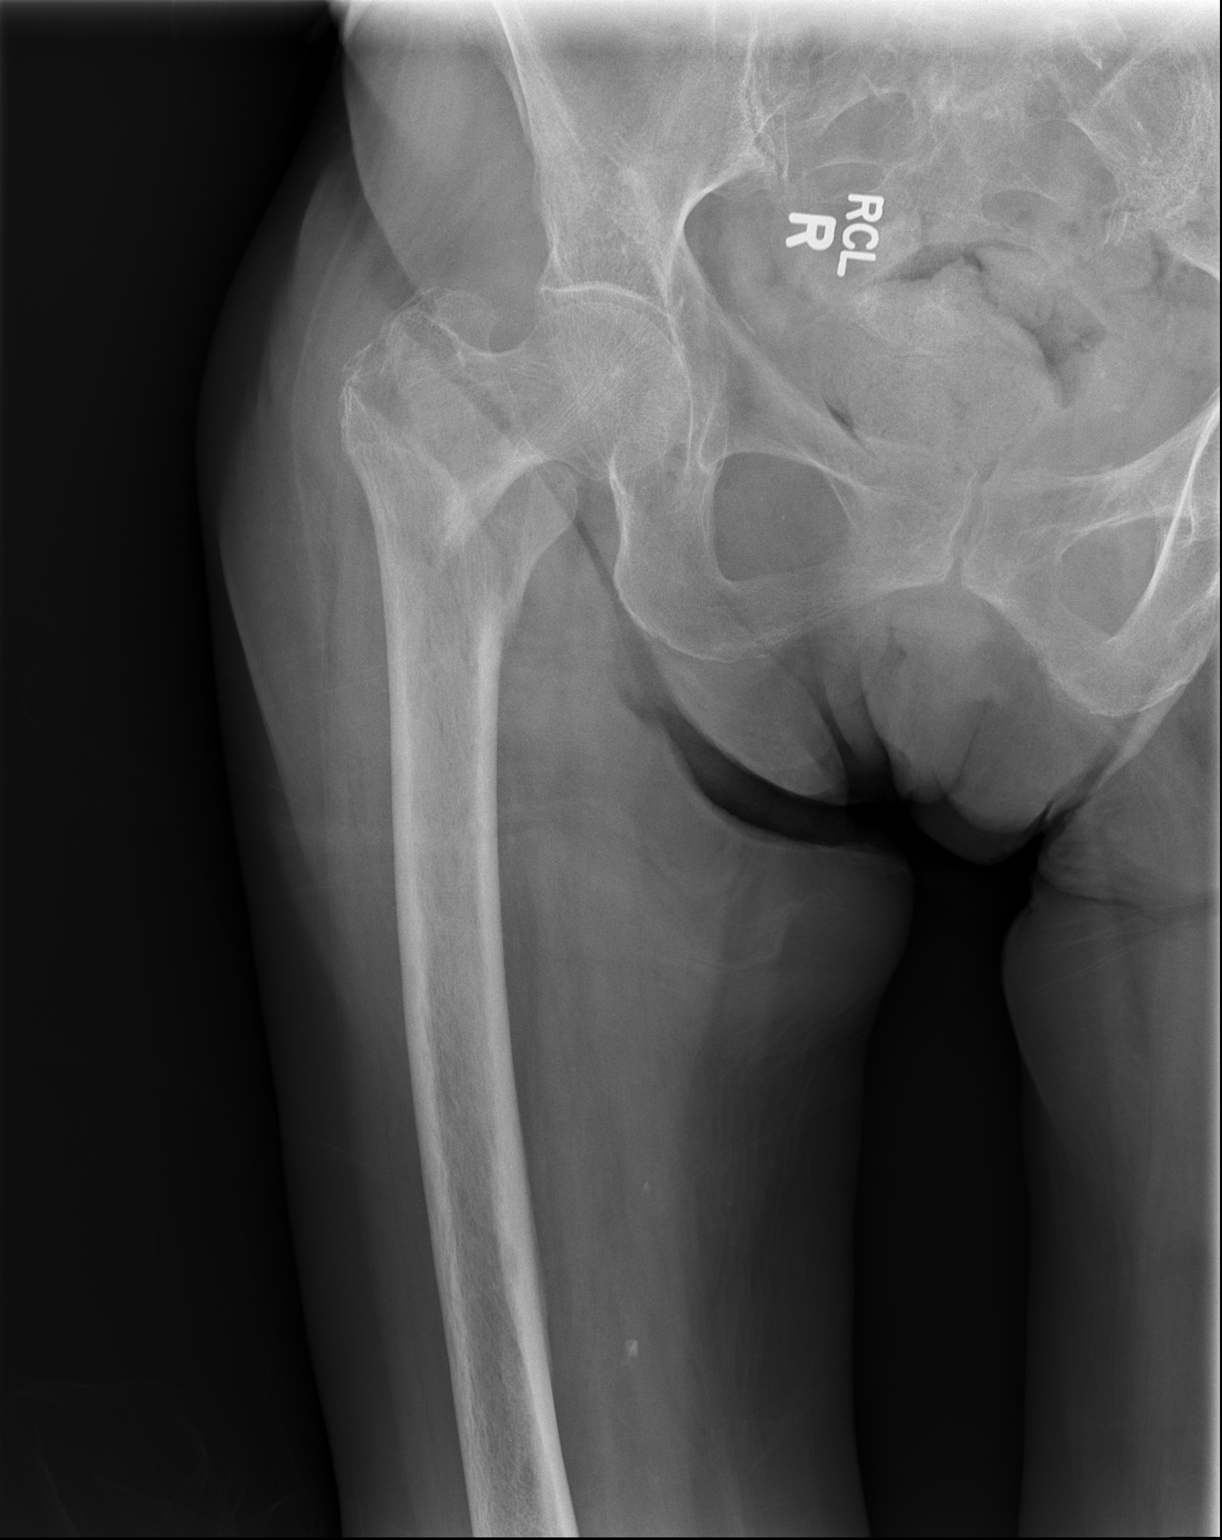
[im 2/2]
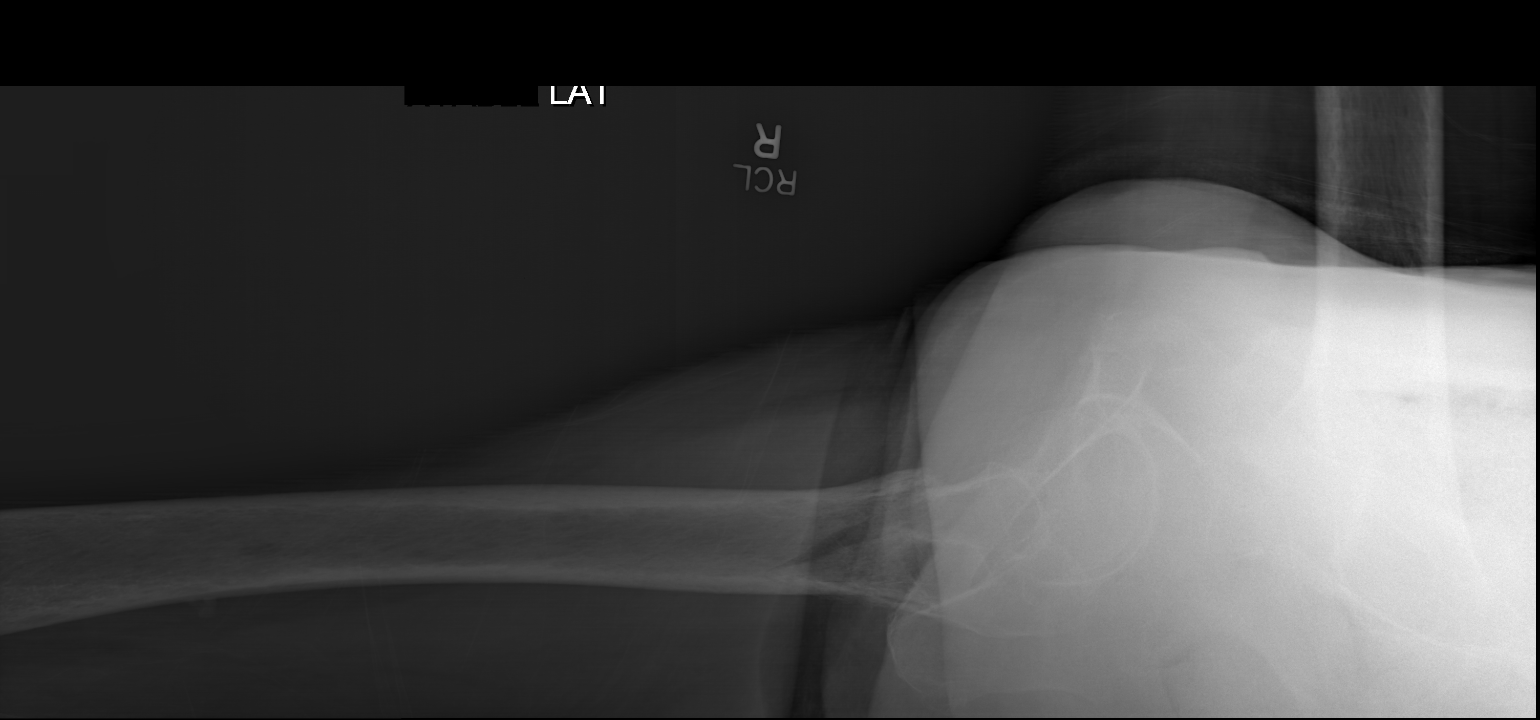

[2 of 2 positions shown; findings below may reference images not displayed]

IMPRESSION: 1. Proximal right femoral fracture.

[REDACTED]

## 2014-11-20 IMAGING — CR DG CHEST 1V PORT
1 series · 1 of 1 positions shown · non-contrast
Comparison: none

REASON FOR EXAM: fall / right hip fx
COMMENTS:

[t chest ap upright]
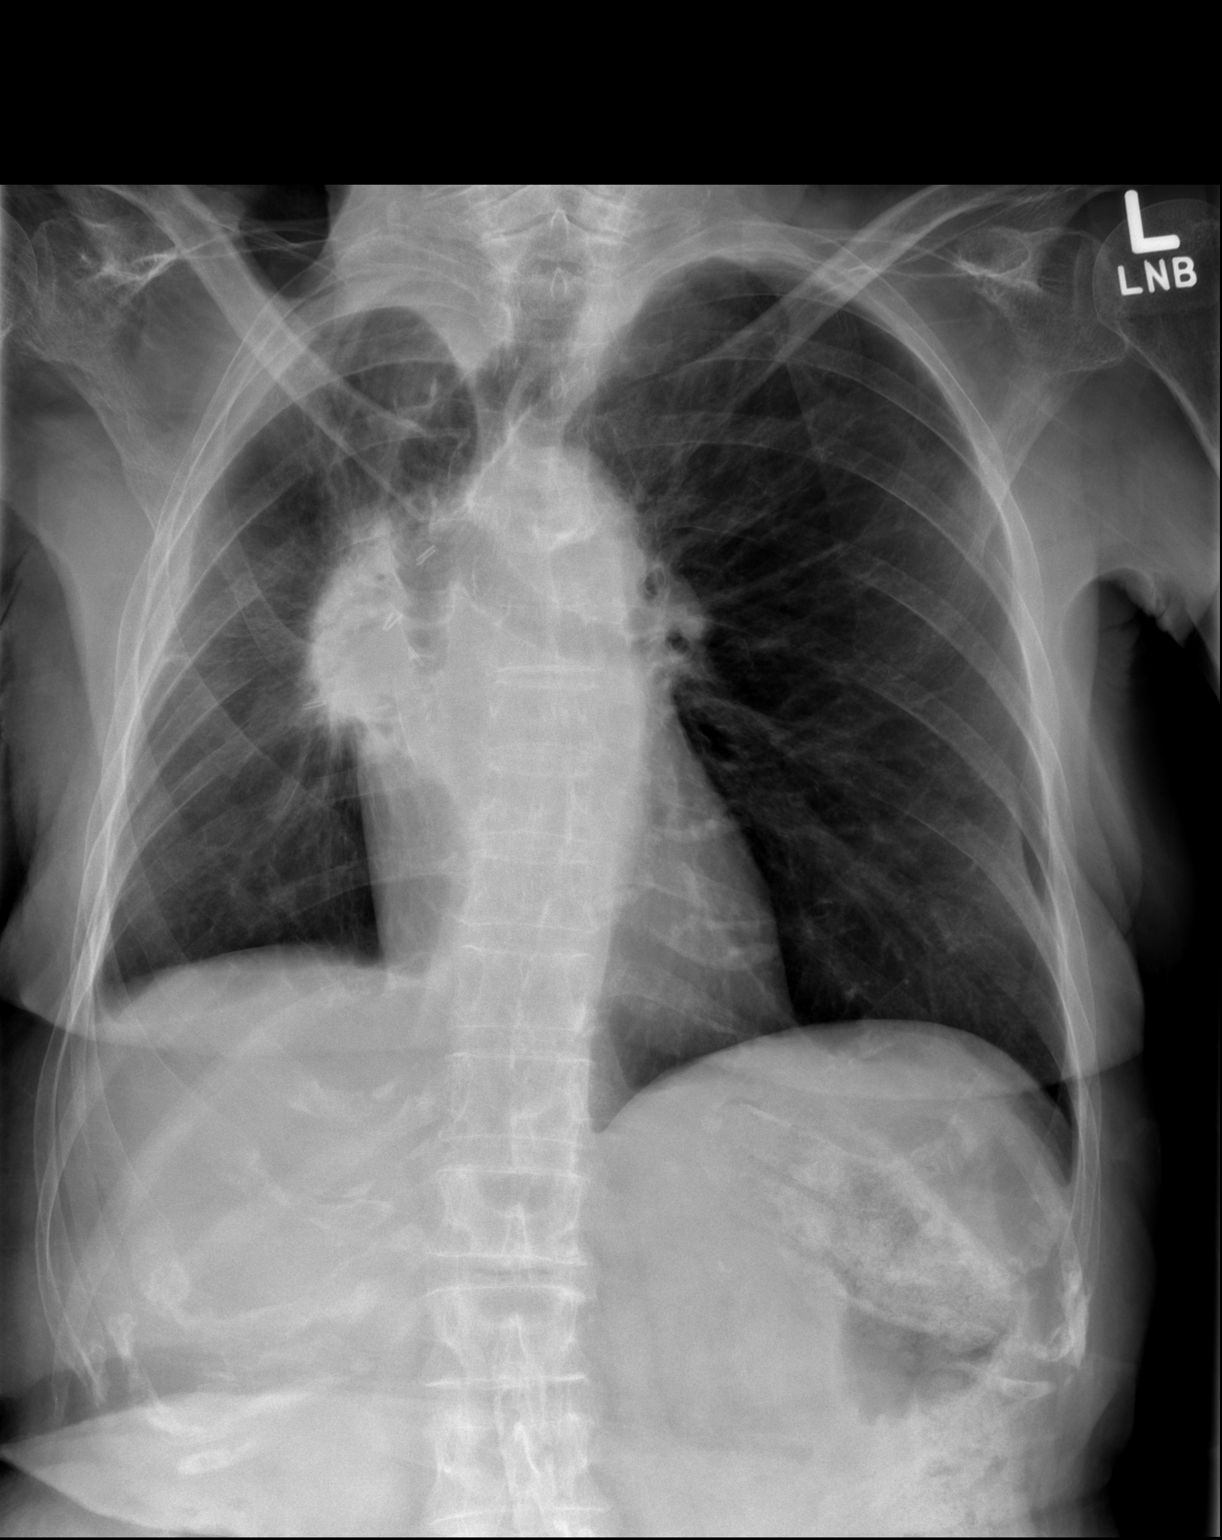

[1 of 1 positions shown; findings below may reference images not displayed]

PROCEDURE:     DXR - DXR PORTABLE CHEST SINGLE VIEW  - April 14, 2013  [DATE]

RESULT:     Comparison is made to the previous examination dated 06/05/2010
and to a study of 09/07/2012. There is a large mass with irregular margins in
the right hilum. The lungs are otherwise clear. Desmoplastic response is
seen in the perihilar region on the right. The heart is nonenlarged. There
is no effusion or pneumothorax. The bones are osteopenic.
IMPRESSION: Enlarging right hilar mass.

[REDACTED]
